# Patient Record
Sex: Female | Born: 1995
Health system: Southern US, Community
[De-identification: ages and names within clinical notes are randomized; demographics above are authoritative.]

## PROBLEM LIST (undated history)

## (undated) DIAGNOSIS — Z973 Presence of spectacles and contact lenses: Secondary | ICD-10-CM

## (undated) DIAGNOSIS — R112 Nausea with vomiting, unspecified: Secondary | ICD-10-CM

## (undated) DIAGNOSIS — N809 Endometriosis, unspecified: Secondary | ICD-10-CM

## (undated) DIAGNOSIS — K219 Gastro-esophageal reflux disease without esophagitis: Secondary | ICD-10-CM

## (undated) DIAGNOSIS — F909 Attention-deficit hyperactivity disorder, unspecified type: Secondary | ICD-10-CM

## (undated) DIAGNOSIS — K589 Irritable bowel syndrome without diarrhea: Secondary | ICD-10-CM

## (undated) DIAGNOSIS — G43909 Migraine, unspecified, not intractable, without status migrainosus: Secondary | ICD-10-CM

## (undated) DIAGNOSIS — F419 Anxiety disorder, unspecified: Secondary | ICD-10-CM

## (undated) DIAGNOSIS — G8929 Other chronic pain: Secondary | ICD-10-CM

## (undated) DIAGNOSIS — T7840XA Allergy, unspecified, initial encounter: Secondary | ICD-10-CM

## (undated) DIAGNOSIS — F32A Depression, unspecified: Secondary | ICD-10-CM

## (undated) DIAGNOSIS — Z8742 Personal history of other diseases of the female genital tract: Secondary | ICD-10-CM

## (undated) DIAGNOSIS — Z9889 Other specified postprocedural states: Secondary | ICD-10-CM

## (undated) DIAGNOSIS — Z975 Presence of (intrauterine) contraceptive device: Secondary | ICD-10-CM

## (undated) DIAGNOSIS — F3112 Bipolar disorder, current episode manic without psychotic features, moderate: Secondary | ICD-10-CM

## (undated) HISTORY — DX: Attention-deficit hyperactivity disorder, unspecified type: F90.9

## (undated) HISTORY — DX: Irritable bowel syndrome, unspecified: K58.9

## (undated) HISTORY — PX: TONSILLECTOMY: SUR1361

## (undated) HISTORY — DX: Bipolar disorder, current episode manic without psychotic features, moderate: F31.12

## (undated) HISTORY — DX: Depression, unspecified: F32.A

## (undated) HISTORY — DX: Endometriosis, unspecified: N80.9

## (undated) HISTORY — DX: Allergy, unspecified, initial encounter: T78.40XA

## (undated) HISTORY — DX: Anxiety disorder, unspecified: F41.9

---
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
## 2017-05-19 DIAGNOSIS — Z9189 Other specified personal risk factors, not elsewhere classified: Secondary | ICD-10-CM

## 2017-05-19 HISTORY — DX: Other specified personal risk factors, not elsewhere classified: Z91.89

## 2018-02-06 DIAGNOSIS — S46912A Strain of unspecified muscle, fascia and tendon at shoulder and upper arm level, left arm, initial encounter: Secondary | ICD-10-CM | POA: Diagnosis not present

## 2019-05-23 DIAGNOSIS — M778 Other enthesopathies, not elsewhere classified: Secondary | ICD-10-CM | POA: Diagnosis not present

## 2019-06-01 DIAGNOSIS — F321 Major depressive disorder, single episode, moderate: Secondary | ICD-10-CM | POA: Diagnosis not present

## 2019-06-01 DIAGNOSIS — R630 Anorexia: Secondary | ICD-10-CM | POA: Diagnosis not present

## 2019-06-01 DIAGNOSIS — R112 Nausea with vomiting, unspecified: Secondary | ICD-10-CM | POA: Diagnosis not present

## 2019-06-01 DIAGNOSIS — M779 Enthesopathy, unspecified: Secondary | ICD-10-CM | POA: Diagnosis not present

## 2019-06-07 DIAGNOSIS — M79631 Pain in right forearm: Secondary | ICD-10-CM | POA: Diagnosis not present

## 2019-06-07 DIAGNOSIS — M7701 Medial epicondylitis, right elbow: Secondary | ICD-10-CM | POA: Diagnosis not present

## 2019-06-14 DIAGNOSIS — M7701 Medial epicondylitis, right elbow: Secondary | ICD-10-CM | POA: Diagnosis not present

## 2019-06-14 DIAGNOSIS — M79631 Pain in right forearm: Secondary | ICD-10-CM | POA: Diagnosis not present

## 2019-06-29 DIAGNOSIS — R1084 Generalized abdominal pain: Secondary | ICD-10-CM | POA: Diagnosis not present

## 2019-06-29 DIAGNOSIS — R198 Other specified symptoms and signs involving the digestive system and abdomen: Secondary | ICD-10-CM | POA: Diagnosis not present

## 2019-06-29 DIAGNOSIS — R634 Abnormal weight loss: Secondary | ICD-10-CM | POA: Diagnosis not present

## 2019-06-29 DIAGNOSIS — R140 Abdominal distension (gaseous): Secondary | ICD-10-CM | POA: Diagnosis not present

## 2019-07-14 DIAGNOSIS — R1084 Generalized abdominal pain: Secondary | ICD-10-CM | POA: Diagnosis not present

## 2019-07-14 DIAGNOSIS — R140 Abdominal distension (gaseous): Secondary | ICD-10-CM | POA: Diagnosis not present

## 2019-07-14 DIAGNOSIS — R198 Other specified symptoms and signs involving the digestive system and abdomen: Secondary | ICD-10-CM | POA: Diagnosis not present

## 2019-08-08 DIAGNOSIS — R140 Abdominal distension (gaseous): Secondary | ICD-10-CM | POA: Diagnosis not present

## 2019-08-08 DIAGNOSIS — R1011 Right upper quadrant pain: Secondary | ICD-10-CM | POA: Diagnosis not present

## 2019-08-08 DIAGNOSIS — R198 Other specified symptoms and signs involving the digestive system and abdomen: Secondary | ICD-10-CM | POA: Diagnosis not present

## 2019-08-08 DIAGNOSIS — R110 Nausea: Secondary | ICD-10-CM | POA: Diagnosis not present

## 2019-08-09 ENCOUNTER — Other Ambulatory Visit: Payer: Self-pay | Admitting: Gastroenterology

## 2019-08-09 ENCOUNTER — Other Ambulatory Visit (HOSPITAL_COMMUNITY): Payer: Self-pay | Admitting: Gastroenterology

## 2019-08-09 DIAGNOSIS — R1011 Right upper quadrant pain: Secondary | ICD-10-CM

## 2019-08-22 ENCOUNTER — Other Ambulatory Visit: Payer: Self-pay

## 2019-08-22 ENCOUNTER — Encounter (HOSPITAL_COMMUNITY)
Admission: RE | Admit: 2019-08-22 | Discharge: 2019-08-22 | Disposition: A | Discharge status: Home / Self Care | Payer: BC Managed Care – PPO | Source: Ambulatory Visit | Attending: Gastroenterology | Admitting: Gastroenterology

## 2019-08-22 DIAGNOSIS — R1011 Right upper quadrant pain: Secondary | ICD-10-CM | POA: Insufficient documentation

## 2019-08-22 MED ORDER — TECHNETIUM TC 99M MEBROFENIN IV KIT
5.0000 | PACK | Freq: Once | INTRAVENOUS | Status: AC | PRN
  Administered 2019-08-22: 15:00:00 5 via INTRAVENOUS

## 2019-09-01 ENCOUNTER — Encounter: Payer: BC Managed Care – PPO | Admitting: Obstetrics and Gynecology

## 2019-09-08 ENCOUNTER — Ambulatory Visit: Payer: BC Managed Care – PPO | Attending: Family

## 2019-09-08 DIAGNOSIS — Z23 Encounter for immunization: Secondary | ICD-10-CM

## 2019-09-08 NOTE — Progress Notes (Signed)
   Covid-19 Vaccination Clinic  Name:  Debbie Warner    MRN: [id number] DOB: 08/17/1995  09/08/2019  [name] was observed post Covid-19 immunization for 15 minutes without incident. She was provided with Vaccine Information Sheet and instruction to access the V-Safe system.   [name] was instructed to call 911 with any severe reactions post vaccine: Marland Kitchen Difficulty breathing  . Swelling of face and throat  . A fast heartbeat  . A bad rash all over body  . Dizziness and weakness   Immunizations Administered    Name Date Dose VIS Date Route   Moderna COVID-19 Vaccine 09/08/2019  4:25 PM 0.5 mL 04/2019 Intramuscular   Manufacturer: Moderna   Lot: [id number]   NDC: 44458-483-50

## 2019-09-20 ENCOUNTER — Other Ambulatory Visit: Payer: Self-pay | Admitting: Gastroenterology

## 2019-09-20 DIAGNOSIS — R140 Abdominal distension (gaseous): Secondary | ICD-10-CM | POA: Diagnosis not present

## 2019-09-20 DIAGNOSIS — R1031 Right lower quadrant pain: Secondary | ICD-10-CM

## 2019-09-20 DIAGNOSIS — R112 Nausea with vomiting, unspecified: Secondary | ICD-10-CM | POA: Diagnosis not present

## 2019-09-20 DIAGNOSIS — R198 Other specified symptoms and signs involving the digestive system and abdomen: Secondary | ICD-10-CM | POA: Diagnosis not present

## 2019-09-30 ENCOUNTER — Ambulatory Visit
Admission: RE | Admit: 2019-09-30 | Discharge: 2019-09-30 | Disposition: A | Discharge status: Home / Self Care | Payer: BC Managed Care – PPO | Source: Ambulatory Visit | Attending: Gastroenterology | Admitting: Gastroenterology

## 2019-09-30 ENCOUNTER — Other Ambulatory Visit: Payer: Self-pay

## 2019-09-30 DIAGNOSIS — R1031 Right lower quadrant pain: Secondary | ICD-10-CM | POA: Diagnosis not present

## 2019-09-30 MED ORDER — IOPAMIDOL (ISOVUE-300) INJECTION 61%
100.0000 mL | Freq: Once | INTRAVENOUS | Status: AC | PRN
  Administered 2019-09-30: 100 mL via INTRAVENOUS

## 2019-10-04 ENCOUNTER — Ambulatory Visit: Payer: BC Managed Care – PPO | Attending: Family

## 2019-10-04 DIAGNOSIS — Z23 Encounter for immunization: Secondary | ICD-10-CM

## 2019-10-04 NOTE — Progress Notes (Signed)
   Covid-19 Vaccination Clinic  Name:  Debbie Warner    MRN: [id number] DOB: 10/05/1995  10/04/2019  [name] was observed post Covid-19 immunization for 15 minutes without incident. She was provided with Vaccine Information Sheet and instruction to access the V-Safe system.   [name] was instructed to call 911 with any severe reactions post vaccine: Marland Kitchen Difficulty breathing  . Swelling of face and throat  . A fast heartbeat  . A bad rash all over body  . Dizziness and weakness   Immunizations Administered    Name Date Dose VIS Date Route   Moderna COVID-19 Vaccine 10/04/2019  2:16 PM 0.5 mL 04/2019 Intramuscular   Manufacturer: Moderna   Lot: [id number]   NDC: 52591-028-90

## 2019-10-31 ENCOUNTER — Other Ambulatory Visit (HOSPITAL_COMMUNITY)
Admission: RE | Admit: 2019-10-31 | Discharge: 2019-10-31 | Disposition: A | Discharge status: Home / Self Care | Payer: BC Managed Care – PPO | Source: Ambulatory Visit | Attending: Obstetrics and Gynecology | Admitting: Obstetrics and Gynecology

## 2019-10-31 ENCOUNTER — Ambulatory Visit (INDEPENDENT_AMBULATORY_CARE_PROVIDER_SITE_OTHER): Payer: BC Managed Care – PPO | Admitting: Obstetrics and Gynecology

## 2019-10-31 ENCOUNTER — Other Ambulatory Visit: Payer: Self-pay

## 2019-10-31 ENCOUNTER — Encounter: Payer: Self-pay | Admitting: Obstetrics and Gynecology

## 2019-10-31 VITALS — BP 100/64 | HR 88 | Temp 97.2°F | Resp 18 | Ht 66.5 in | Wt 147.0 lb

## 2019-10-31 DIAGNOSIS — Z113 Encounter for screening for infections with a predominantly sexual mode of transmission: Secondary | ICD-10-CM | POA: Insufficient documentation

## 2019-10-31 DIAGNOSIS — N760 Acute vaginitis: Secondary | ICD-10-CM

## 2019-10-31 DIAGNOSIS — Z01419 Encounter for gynecological examination (general) (routine) without abnormal findings: Secondary | ICD-10-CM | POA: Diagnosis not present

## 2019-10-31 DIAGNOSIS — G8929 Other chronic pain: Secondary | ICD-10-CM

## 2019-10-31 DIAGNOSIS — R1031 Right lower quadrant pain: Secondary | ICD-10-CM | POA: Diagnosis not present

## 2019-10-31 DIAGNOSIS — R87612 Low grade squamous intraepithelial lesion on cytologic smear of cervix (LGSIL): Secondary | ICD-10-CM | POA: Diagnosis not present

## 2019-10-31 NOTE — Progress Notes (Signed)
GYNECOLOGY OFFICE VISIT  24 y.o. G0P0000 Unknown Caucasian female here for annual exam.    Patient complaining of vaginal discharge.  Denies odor.   Patient also had recent CT ABD/Pelvis 10/01/19. This was ordered by her GI due to "stomach issues". CT showed multiple cysts of the ovaries. Normal appendix.  See Epic.  RLQ pain since 2015. Nausea for years.  Uses Zofran as needed. Dr. Guillermina [place] at [facility].  She had a normal HIDA scan.   Had laparoscopy in 2016 and told she had endometriosis.  She had scar tissue noted at the time.  She was offered Depo Lupron but she declined.  She did get resolution of her pain for some years.  Birth control pills made her suicidal in the past.  Used Nexplanon also.   She currently has right lower quadrant pain and cannot lie on her back.  She feels some need to have a BM and doing this relieves her pain again.  No blood in her stool.  Patient is interested in doing another laparoscopy.  She notices increased facial hair and thigh hair.  Completed her Covid vaccine.   PCP:   None  No LMP recorded. (Menstrual status: IUD).           Sexually active: No.  The current method of family planning is IUD--Mirena 05/2017.    Exercising: No.  The patient does not participate in regular exercise at present. Smoker:  Yes, smokes Marijuana for nausea  Health Maintenance: Pap: 11/2018 abnormal per patient History of abnormal Pap:  Yes, Hx of abnormal pap 11/2018 and had colposcopy but no treatment--in [place] MMG:  n/a Colonoscopy:  n/a BMD:   n/a  Result  n/a TDaP:  Up to date Gardasil:   yes HIV:no Hep C:no Screening Labs:  ---   reports that she has never smoked. She has never used smokeless tobacco. She reports previous alcohol use. She reports current drug use. Frequency: 1.00 time per week. Drug: Marijuana.  Past Medical History:  Diagnosis Date  . Abnormal Pap smear of cervix    Hx of colposcopy  . ADHD   . Anxiety   . Depression   .  Dysmenorrhea   . Endometriosis   . History of suicidal tendencies 2019  . Hives    Hx of "anxiety Hives"--takes hydroxyzine  . IBS (irritable bowel syndrome)     Past Surgical History:  Procedure Laterality Date  . fractured arm Right 2008  . PELVIC LAPAROSCOPY  2016   dx'd with endometriosis per patient--[place]  . TONSILLECTOMY      Current Outpatient Medications  Medication Sig Dispense Refill  . esomeprazole (NEXIUM) 20 MG packet Take 20 mg by mouth daily before breakfast.    . hydrOXYzine (ATARAX/VISTARIL) 25 MG tablet Take 25 mg by mouth as needed.    . ondansetron (ZOFRAN) 4 MG tablet Take 4 mg by mouth at bedtime.     No current facility-administered medications for this visit.    Family History  Problem Relation Age of Onset  . Diabetes Mother   . Cancer Maternal Grandmother 75       Dec from colon cancer  . Diabetes Maternal Grandfather   . Stroke Maternal Grandfather   . Stroke Maternal Aunt     Review of Systems  All other systems reviewed and are negative.   Exam:   BP 100/64   Pulse 88   Temp (!) 97.2 F (36.2 C) (Temporal)   Resp 18  Ht 5' 6.5" (1.689 m)   Wt 147 lb (66.7 kg)   BMI 23.37 kg/m     General appearance: alert, cooperative and appears stated age Head: normocephalic, without obvious abnormality, atraumatic Neck: no adenopathy, supple, symmetrical, trachea midline and thyroid normal to inspection and palpation Lungs: clear to auscultation bilaterally Breasts: normal appearance, no masses or tenderness, No nipple retraction or dimpling, No nipple discharge or bleeding, No axillary adenopathy Heart: regular rate and rhythm Abdomen: soft, non-tender; no masses, no organomegaly Extremities: extremities normal, atraumatic, no cyanosis or edema Skin: skin color, texture, turgor normal. No rashes or lesions Lymph nodes: cervical, supraclavicular, and axillary nodes normal. Neurologic: grossly normal  Pelvic: External genitalia:  no  lesions              No abnormal inguinal nodes palpated.              Urethra:  normal appearing urethra with no masses, tenderness or lesions              Bartholins and Skenes: normal                 Vagina: normal appearing vagina with normal color and discharge, no lesions              Cervix: no lesions.  IUD strings noted.               Pap taken: Yes.   Bimanual Exam:  Uterus:  normal size, contour, position, consistency, mobility, non-tender              Adnexa: no mass, fullness, tenderness              Rectal exam:  No masses.  Normal sphincter tone.   Chaperone was present for exam.  Assessment:   Well woman visit with normal exam. Hx abnormal pap.  Hx laparoscopy for endometriosis.  RLQ pain.  STD screening. Vaginitis.  Mirena IUD.  Hx suicidal ideation.   Plan: Mammogram screening discussed. Self breast awareness reviewed. Pap and HR HPV as above. Guidelines for Calcium, Vitamin D, regular exercise program including cardiovascular and weight bearing exercise. STD screening.  Routine labs. Return for pelvic US.  Will get a copy of her abnormal pap and colposcopy and prior laparoscopy surgery. Follow up annually and prn.   After visit summary provided.

## 2019-10-31 NOTE — Patient Instructions (Signed)

## 2019-11-01 ENCOUNTER — Telehealth: Payer: Self-pay | Admitting: Obstetrics and Gynecology

## 2019-11-01 LAB — COMPREHENSIVE METABOLIC PANEL
ALT: 4 IU/L (ref 0–32)
AST: 9 IU/L (ref 0–40)
Albumin/Globulin Ratio: 2.1 (ref 1.2–2.2)
Albumin: 4.6 g/dL (ref 3.9–5.0)
Alkaline Phosphatase: 73 IU/L (ref 48–121)
BUN/Creatinine Ratio: 8 — ABNORMAL LOW (ref 9–23)
BUN: 6 mg/dL (ref 6–20)
Bilirubin Total: 0.5 mg/dL (ref 0.0–1.2)
CO2: 22 mmol/L (ref 20–29)
Calcium: 9.8 mg/dL (ref 8.7–10.2)
Chloride: 106 mmol/L (ref 96–106)
Creatinine, Ser: 0.8 mg/dL (ref 0.57–1.00)
GFR calc Af Amer: 120 mL/min/{1.73_m2} (ref >59)
GFR calc non Af Amer: 104 mL/min/{1.73_m2} (ref >59)
Globulin, Total: 2.2 g/dL (ref 1.5–4.5)
Glucose: 86 mg/dL (ref 65–99)
Potassium: 4.1 mmol/L (ref 3.5–5.2)
Sodium: 141 mmol/L (ref 134–144)
Total Protein: 6.8 g/dL (ref 6.0–8.5)

## 2019-11-01 LAB — HEP, RPR, HIV PANEL
HIV Screen 4th Generation wRfx: NONREACTIVE
Hepatitis B Surface Ag: NEGATIVE
RPR Ser Ql: NONREACTIVE

## 2019-11-01 LAB — CYTOLOGY - PAP
Comment: NEGATIVE
High risk HPV: POSITIVE — AB

## 2019-11-01 LAB — LIPID PANEL
Chol/HDL Ratio: 3.5 ratio (ref 0.0–4.4)
Cholesterol, Total: 142 mg/dL (ref 100–199)
HDL: 41 mg/dL (ref >39)
LDL Chol Calc (NIH): 83 mg/dL (ref 0–99)
Triglycerides: 98 mg/dL (ref 0–149)
VLDL Cholesterol Cal: 18 mg/dL (ref 5–40)

## 2019-11-01 LAB — CBC
Hematocrit: 42.2 % (ref 34.0–46.6)
Hemoglobin: 13.9 g/dL (ref 11.1–15.9)
MCH: 30.2 pg (ref 26.6–33.0)
MCHC: 32.9 g/dL (ref 31.5–35.7)
MCV: 92 fL (ref 79–97)
Platelets: 397 10*3/uL (ref 150–450)
RBC: 4.61 x10E6/uL (ref 3.77–5.28)
RDW: 13.3 % (ref 11.7–15.4)
WBC: 10.2 10*3/uL (ref 3.4–10.8)

## 2019-11-01 LAB — HEPATITIS C ANTIBODY: Hep C Virus Ab: <0.1 s/co ratio (ref 0.0–0.9)

## 2019-11-01 NOTE — Telephone Encounter (Signed)
Call to patient. Unable to leave voicemail requesting a return call to Hayley to review benefits and schedule recommended Pelvic ultrasound with Brook A. Silva, MD, FACOG °

## 2019-11-02 ENCOUNTER — Telehealth: Payer: Self-pay

## 2019-11-02 LAB — CERVICOVAGINAL ANCILLARY ONLY
Bacterial Vaginitis (gardnerella): NEGATIVE
Candida Glabrata: NEGATIVE
Candida Vaginitis: NEGATIVE
Chlamydia: NEGATIVE
Comment: NEGATIVE
Comment: NEGATIVE
Comment: NEGATIVE
Comment: NEGATIVE
Comment: NEGATIVE
Comment: NORMAL
Neisseria Gonorrhea: NEGATIVE
Trichomonas: NEGATIVE

## 2019-11-02 NOTE — Telephone Encounter (Signed)
Spoke with pt. Pt given results and recommendations per Dr Edward Jolly. Pt agreeable and verbalized understanding.  Pt tearful with the results. Pt wants to do colpo as soon as possible. Pt advised no appts available at this moment. Advised will return call with appt for colpo. Pt agreeable.

## 2019-11-02 NOTE — Telephone Encounter (Signed)
Patient is returning call to Stephanie.

## 2019-11-02 NOTE — Telephone Encounter (Signed)
-----   Message from Patton Salles, MD sent at 11/02/2019 12:46 PM EDT ----- Please contact patient with results of testing.  Her pap showed LGSIL and possible HGSIL, and she tested positive for HR HPV.  She needs colposcopy.  Please precert and schedule with me.   Her testing for infectious disease is negative for HIV, syphilis, hepatitis B and C, trichomonas, gonorrhea, and chlamydia.   Vaginitis testing is negative for yeast and bacterial vaginosis.   Cholesterol, blood counts, and blood chemistries looked good.

## 2019-11-02 NOTE — Telephone Encounter (Signed)
Reviewed with Dr Edward Jolly. Pt to have colpo on 11/08/19 at 945 am.   Call placed back to pt. Pt given update on appt for colpo. Pt states will be out of town from 6/22-6/27. Pt scheduled for colpo on 7/1 at 10 am. Advised will review appt with Dr Edward Jolly and return call to pt with any further recommendations or advice. Pt agreeable. Pt verbalized understanding of date and time of appt for colpo. Pt advised to take Motrin 800 mg with food and water one hour before procedure. Pt agreeable.   Routing to Dr. Edward Jolly for review.  Encounter closed.

## 2019-11-03 NOTE — Telephone Encounter (Signed)
Spoke with patient regarding benefits for recommended ultrasound. Patient is aware that ultrasound is transvaginal. Patient acknowledges understanding of information presented. Patient is aware of cancellation policy. Patient scheduled appointment for 11/24/2019 at 1100AM with Brook A. Edward Jolly, MD, [facility]. Encounter closed.

## 2019-11-03 NOTE — Telephone Encounter (Signed)
Patient is calling to check status of making [place] appointment.

## 2019-11-09 ENCOUNTER — Telehealth: Payer: Self-pay | Admitting: Obstetrics and Gynecology

## 2019-11-09 ENCOUNTER — Other Ambulatory Visit: Payer: Self-pay | Admitting: *Deleted

## 2019-11-09 DIAGNOSIS — R87612 Low grade squamous intraepithelial lesion on cytologic smear of cervix (LGSIL): Secondary | ICD-10-CM

## 2019-11-09 DIAGNOSIS — R87810 Cervical high risk human papillomavirus (HPV) DNA test positive: Secondary | ICD-10-CM

## 2019-11-09 NOTE — Telephone Encounter (Signed)
Call placed to convey benefits. Spoke with the patient and conveyed the benefits. Patient understands/agreeable with the benefits. Patient is aware of the cancellation policy. Appointment scheduled 11/17/19.

## 2019-11-14 ENCOUNTER — Telehealth: Payer: Self-pay

## 2019-11-14 NOTE — Telephone Encounter (Signed)
Patient called to cancel Colpo due to being sick. Patient would like to reschedule as soon as possible.

## 2019-11-14 NOTE — Telephone Encounter (Signed)
Spoke with pt. Pt states having cold/congestion symptoms while being out of town visiting family. Pt states taking allergy medication and is better. Thinks it was from environment she was in with pastures and corn fields.   Pt cancelled colpo for 7/1. Pt rescheduled colpo for 12/01/19 at 1130 am with Dr Edward Jolly. Pt advised to keep PUS on 11/24/19 and to call in 48 advance if still not feeling better to reschedule. Pt verbalized understanding of both appt. Cancellation policy reviewed, pt agreeable.   Routing to Dr Edward Jolly for review.  Encounter closed.

## 2019-11-17 ENCOUNTER — Ambulatory Visit: Payer: Self-pay | Admitting: Obstetrics and Gynecology

## 2019-11-24 ENCOUNTER — Ambulatory Visit (INDEPENDENT_AMBULATORY_CARE_PROVIDER_SITE_OTHER): Payer: BC Managed Care – PPO | Admitting: Obstetrics and Gynecology

## 2019-11-24 ENCOUNTER — Ambulatory Visit (INDEPENDENT_AMBULATORY_CARE_PROVIDER_SITE_OTHER): Payer: BC Managed Care – PPO

## 2019-11-24 ENCOUNTER — Other Ambulatory Visit: Payer: Self-pay

## 2019-11-24 ENCOUNTER — Encounter: Payer: Self-pay | Admitting: Obstetrics and Gynecology

## 2019-11-24 VITALS — BP 100/70 | HR 88 | Ht 66.5 in | Wt 147.0 lb

## 2019-11-24 DIAGNOSIS — R1031 Right lower quadrant pain: Secondary | ICD-10-CM

## 2019-11-24 DIAGNOSIS — R87810 Cervical high risk human papillomavirus (HPV) DNA test positive: Secondary | ICD-10-CM | POA: Diagnosis not present

## 2019-11-24 DIAGNOSIS — G8929 Other chronic pain: Secondary | ICD-10-CM

## 2019-11-24 DIAGNOSIS — R87612 Low grade squamous intraepithelial lesion on cytologic smear of cervix (LGSIL): Secondary | ICD-10-CM | POA: Diagnosis not present

## 2019-11-24 DIAGNOSIS — R102 Pelvic and perineal pain: Secondary | ICD-10-CM | POA: Diagnosis not present

## 2019-11-24 DIAGNOSIS — R198 Other specified symptoms and signs involving the digestive system and abdomen: Secondary | ICD-10-CM

## 2019-11-24 MED ORDER — ALPRAZOLAM 0.5 MG PO TABS: ORAL_TABLET | ORAL | 0 refills | Status: DC

## 2019-11-24 NOTE — Progress Notes (Signed)
GYNECOLOGY  VISIT   HPI: 24 y.o.   Unknown  Caucasian  female   G0P0000 with No LMP recorded. (Menstrual status: IUD).   here for pelvic ultrasound.    Chronic RLQ pain.  Op report received from her laparoscopy indicating she had lysis of adhesions.  Having a BM relieves her pain.  Sees Dr. Guillermina City for her GI care.    She has not discussed this pain with him.  She had a Hida scan and a CT scan.  She did not have a colonoscopy.   Has Mirena IUD.  Hx suicidal ideation with OCPs.  Hx laparoscopy with lysis of adhesions in 2016.  Feels like she has a cut on the inside of the vaginal.  She had a painful orgasm recently with masturbation and use of a vaginal toy.  She was very anxious during the colposcopy.  She has taken valium prior to dental work.   GYNECOLOGIC HISTORY: No LMP recorded. (Menstrual status: IUD). Contraception:  Mirena IUD 05/2017 Menopausal hormone therapy:  n/a Last mammogram:  n/a Last pap smear: 10-31-19 LGSIL:Pos HR HPV, 11/2018 abnormal per pt        OB History    Gravida  0   Para  0   Term  0   Preterm  0   AB  0   Living  0     SAB  0   TAB  0   Ectopic  0   Multiple  0   Live Births  0              There are no problems to display for this patient.   Past Medical History:  Diagnosis Date  . Abnormal Pap smear of cervix    Hx of colposcopy  . ADHD   . Anxiety   . Depression   . Dysmenorrhea   . Endometriosis   . History of suicidal tendencies 2019  . Hives    Hx of "anxiety Hives"--takes hydroxyzine  . IBS (irritable bowel syndrome)     Past Surgical History:  Procedure Laterality Date  . fractured arm Right 2008  . PELVIC LAPAROSCOPY  2016   dx'd with endometriosis per patient--[place], [place].  Op report indicates lysis of adhesions.   . TONSILLECTOMY      Current Outpatient Medications  Medication Sig Dispense Refill  . dicyclomine (BENTYL) 10 MG capsule Take 10 mg by mouth 4 (four) times daily -  before meals  and at bedtime.    Marland Kitchen esomeprazole (NEXIUM) 20 MG packet Take 20 mg by mouth daily before breakfast.    . hydrOXYzine (ATARAX/VISTARIL) 25 MG tablet Take 25 mg by mouth as needed.    . ondansetron (ZOFRAN) 4 MG tablet Take 4 mg by mouth at bedtime.    . ALPRAZolam (XANAX) 0.5 MG tablet Take one tablet (0.5 mg) one hour prior to procedure. 2 tablet 0   No current facility-administered medications for this visit.     ALLERGIES: Hydrocodone, Macrobid [nitrofurantoin], and Sulfa antibiotics  Family History  Problem Relation Age of Onset  . Diabetes Mother   . Cancer Maternal Grandmother 75       Dec from colon cancer  . Diabetes Maternal Grandfather   . Stroke Maternal Grandfather   . Stroke Maternal Aunt     Social History   Socioeconomic History  . Marital status: Unknown    Spouse name: Not on file  . Number of children: Not on file  . Years of  education: Not on file  . Highest education level: Not on file  Occupational History  . Not on file  Tobacco Use  . Smoking status: Never Smoker  . Smokeless tobacco: Never Used  Vaping Use  . Vaping Use: Never used  Substance and Sexual Activity  . Alcohol use: Not Currently  . Drug use: Yes    Frequency: 1.0 times per week    Types: Marijuana    Comment: uses for nausea  . Sexual activity: Not Currently    Birth control/protection: I.U.D.    Comment: Mirena IUD 05/2017  Other Topics Concern  . Not on file  Social History Narrative  . Not on file   Social Determinants of Health   Financial Resource Strain:   . Difficulty of Paying Living Expenses:   Food Insecurity:   . Worried About Programme researcher, broadcasting/film/video in the Last Year:   . Barista in the Last Year:   Transportation Needs:   . Freight forwarder (Medical):   Marland Kitchen Lack of Transportation (Non-Medical):   Physical Activity:   . Days of Exercise per Week:   . Minutes of Exercise per Session:   Stress:   . Feeling of Stress :   Social Connections:   .  Frequency of Communication with Friends and Family:   . Frequency of Social Gatherings with Friends and Family:   . Attends Religious Services:   . Active Member of Clubs or Organizations:   . Attends Banker Meetings:   Marland Kitchen Marital Status:   Intimate Partner Violence:   . Fear of Current or Ex-Partner:   . Emotionally Abused:   Marland Kitchen Physically Abused:   . Sexually Abused:     Review of Systems  All other systems reviewed and are negative.   PHYSICAL EXAMINATION:    BP 100/70   Pulse 88   Ht 5' 6.5" (1.689 m)   Wt 147 lb (66.7 kg)   BMI 23.37 kg/m     General appearance: alert, cooperative and appears stated age   Pelvic: External genitalia:  no lesions              Urethra:  normal appearing urethra with no masses, tenderness or lesions              Bartholins and Skenes: normal                 Vagina: normal appearing vagina with normal color and clumpy white discharge, no lesions              Cervix: no lesions.  IUD strings noted.                Bimanual Exam:  Uterus:  normal size, contour, position, consistency, mobility, non-tender              Adnexa: no mass, fullness, tenderness           Chaperone was present for exam.  Pelvic US Uterus no masses.  EMS 2.31 mm. IUD in endometrial canal.  Ovaries normal.  No free fluid.  ASSESSMENT  Pain with BMs.  Chronic RLQ pain.  Status post laparoscopy with lysis of adhesions on final op report.  Mirena IUD.  Recent abnormal pap - LGSIL and possible HGSIL, positive HR HPV. Vaginal pain.   PLAN  Pelvic US images and reports reviewed. We discussed her pelvic pain and possible etiologies - fibrosis, adhesions, colon source.  We discussed laparoscopy for further  evaluation, but I would recommend seeing GI first.  She will return back to her GI.  She may need a colonoscopy.  Abnormal pap and HPV discussed.  Return for colposcopy.  Affirm testing done.  An After Visit Summary was printed and given to  the patient.  ___45___ minute consultation.

## 2019-11-25 LAB — VAGINITIS/VAGINOSIS, DNA PROBE
Candida Species: NEGATIVE
Gardnerella vaginalis: NEGATIVE
Trichomonas vaginosis: NEGATIVE

## 2019-11-27 ENCOUNTER — Encounter: Payer: Self-pay | Admitting: Obstetrics and Gynecology

## 2019-11-29 NOTE — Progress Notes (Signed)
  Subjective:     Patient ID: Debbie Warner, female   DOB: 27-Feb-1996, 24 y.o.   MRN: [id number]  HPI  Patient here today for colposcopy with pap from  10-31-19 revealing LGSIL:Pos HR HPV. Possible HGSIL on pap.  Patient states she had an abnormal pap 11/2018.  Anxiety with procedures. Took Xanax about 11:00 am.  Mother is her to drive her home.  She tested negative for yeast/BV/trichomonas.  She wants blood work for fatigue.  Not sleeping well.  Has anxiety.  [facility]  Review of Systems  LMP: IUD Contraception: Mirena IUD 05/2017     Objective:   Physical Exam Genitourinary:       Colposcopy.  Consent for procedure.  3% acetic acid used.  White light and green light filter used.  Colposcopy satisfactory.  IUD strings noted.  Acetowhite change with mosaics along the superior portion of the cervix from 9:00 to 3:00.  ECC, biopsy at 9:00 and 1:00 - all sent to pathology separately.  No vaginal lesions.  Monsel's placed.  No complications.  Minimal EBL.     Assessment:     Pap LGSIL, possible HGSIL.  Positive HR HPV.  Mirena IUD. Fatigue.    Plan:     FU biopsies.  Post colposcopy precautions given.  If treatment is done, patient would prefer to do in an outpatient setting.  TSH, vit B12, and vit D.

## 2019-12-01 ENCOUNTER — Ambulatory Visit (INDEPENDENT_AMBULATORY_CARE_PROVIDER_SITE_OTHER): Payer: BC Managed Care – PPO | Admitting: Obstetrics and Gynecology

## 2019-12-01 ENCOUNTER — Other Ambulatory Visit (HOSPITAL_COMMUNITY)
Admission: RE | Admit: 2019-12-01 | Discharge: 2019-12-01 | Disposition: A | Discharge status: Home / Self Care | Payer: BC Managed Care – PPO | Source: Ambulatory Visit | Attending: Obstetrics and Gynecology | Admitting: Obstetrics and Gynecology

## 2019-12-01 ENCOUNTER — Other Ambulatory Visit: Payer: Self-pay

## 2019-12-01 ENCOUNTER — Encounter: Payer: Self-pay | Admitting: Obstetrics and Gynecology

## 2019-12-01 VITALS — BP 104/62 | HR 72 | Resp 16 | Ht 66.5 in | Wt 145.0 lb

## 2019-12-01 DIAGNOSIS — R7989 Other specified abnormal findings of blood chemistry: Secondary | ICD-10-CM

## 2019-12-01 DIAGNOSIS — R87612 Low grade squamous intraepithelial lesion on cytologic smear of cervix (LGSIL): Secondary | ICD-10-CM

## 2019-12-01 DIAGNOSIS — R5383 Other fatigue: Secondary | ICD-10-CM

## 2019-12-01 DIAGNOSIS — R978 Other abnormal tumor markers: Secondary | ICD-10-CM | POA: Diagnosis not present

## 2019-12-01 DIAGNOSIS — R87810 Cervical high risk human papillomavirus (HPV) DNA test positive: Secondary | ICD-10-CM | POA: Diagnosis not present

## 2019-12-01 DIAGNOSIS — N870 Mild cervical dysplasia: Secondary | ICD-10-CM | POA: Diagnosis not present

## 2019-12-01 DIAGNOSIS — N871 Moderate cervical dysplasia: Secondary | ICD-10-CM | POA: Diagnosis not present

## 2019-12-01 DIAGNOSIS — N72 Inflammatory disease of cervix uteri: Secondary | ICD-10-CM | POA: Diagnosis not present

## 2019-12-02 LAB — VITAMIN D 25 HYDROXY (VIT D DEFICIENCY, FRACTURES): Vit D, 25-Hydroxy: 18.8 ng/mL — ABNORMAL LOW (ref 30.0–100.0)

## 2019-12-02 LAB — TSH: TSH: 0.878 u[IU]/mL (ref 0.450–4.500)

## 2019-12-02 LAB — VITAMIN B12: Vitamin B-12: 514 pg/mL (ref 232–1245)

## 2019-12-05 ENCOUNTER — Other Ambulatory Visit: Payer: Self-pay | Admitting: *Deleted

## 2019-12-05 MED ORDER — VITAMIN D (ERGOCALCIFEROL) 1.25 MG (50000 UNIT) PO CAPS: 50000.0000 [IU] | ORAL_CAPSULE | ORAL | 0 refills | Status: DC

## 2019-12-06 LAB — SURGICAL PATHOLOGY

## 2019-12-08 ENCOUNTER — Encounter: Payer: Self-pay | Admitting: Obstetrics and Gynecology

## 2019-12-08 ENCOUNTER — Telehealth: Payer: Self-pay | Admitting: Obstetrics and Gynecology

## 2019-12-08 NOTE — Telephone Encounter (Signed)
To Hayley Carder for precert. 

## 2019-12-08 NOTE — Telephone Encounter (Signed)
Routing to Dr. Edward Jolly to review 12/01/19 colpo results.

## 2019-12-08 NOTE — Telephone Encounter (Signed)
Patient called for recent colposcopy results.

## 2019-12-08 NOTE — Telephone Encounter (Signed)
Leda Min, RN  12/08/2019 1:30 PM EDT Back to Top    Spoke with patient, advised as seen below per Dr. Edward Jolly.  Patient request to proceed with scheduling LEEP.  Advised patient Yvonna Alanis, RN will be in contact to review scheduling, will also schedule pre-op OV with Dr. Edward Jolly. Patient verbalizes understanding and is agreeable.   See open telephone encounter dated 12/08/19.     Routing to [employer], RN  Cc: [employer]

## 2019-12-08 NOTE — Telephone Encounter (Signed)
Please see result note with plan.   She has HGSIL with endocervical glandular extension, and I am recommending a LEEP at [facility].

## 2019-12-08 NOTE — Telephone Encounter (Signed)
-----   Message from Patton Salles, MD sent at 12/08/2019  1:03 PM EDT ----- Please contact patient with results of her colposcopy.  See also phone noted.  Her colposcopy shows HGSIL of the cervix.  These are abnormal cells and some extend into of the deeper layers of her cervix.  She does not have cancer.   I recommend she have a LEEP procedure with me at [facility].  This will remove the abnormal cells. She has difficulty with medical procedures and has already requested an outpatient surgical setting if treatment were to be needed.   I do recommend a preop scheduled with me.

## 2019-12-09 NOTE — Telephone Encounter (Signed)
Call to patient. Unable to leave voicemail requesting a return call to [facility] to review benefits for recommended surgery with Brook A. Silva, MD, FACOG. 

## 2019-12-12 NOTE — Telephone Encounter (Signed)
Call to patient. Unable to leave voicemail requesting a return call to [facility] to review benefits for recommended surgery with Brook A. Edward Jolly, MD, [facility].

## 2019-12-13 NOTE — Telephone Encounter (Signed)
Spoke with patient regarding surgery benefits. Patient acknowledges understanding of information presented. Patient is aware that benefits presented are for professional benefits only. Patient is aware that once surgery is scheduled, the hospital will call with separate benefits. Patient is aware of surgery cancellation policy.  Patient is requesting surgery date of 01/17/2020.

## 2019-12-13 NOTE — Telephone Encounter (Signed)
Spoke with patient. LEEP with ECC scheduled for 12/27/2019 at 0730 at [facility]. Pre op scheduled for 12/14/2019 at 4 pm with Dr.Silva. COVID test scheduled for 12/23/2019 at 3:15 pm at [facility] location. Patient is aware of the need to quarantine after test until surgery. 4 week post op scheduled for 01/24/2020 at 4:30 pm with Dr.Silva. Surgery instructions reviewed and to be given to patient at her appointment on 12/14/2019.  Routing to provider and will close encounter.

## 2019-12-14 ENCOUNTER — Encounter: Payer: Self-pay | Admitting: Obstetrics and Gynecology

## 2019-12-14 ENCOUNTER — Ambulatory Visit (INDEPENDENT_AMBULATORY_CARE_PROVIDER_SITE_OTHER): Payer: BC Managed Care – PPO | Admitting: Obstetrics and Gynecology

## 2019-12-14 ENCOUNTER — Other Ambulatory Visit: Payer: Self-pay

## 2019-12-14 VITALS — BP 100/60 | HR 84 | Ht 66.5 in | Wt 148.0 lb

## 2019-12-14 DIAGNOSIS — R87613 High grade squamous intraepithelial lesion on cytologic smear of cervix (HGSIL): Secondary | ICD-10-CM | POA: Diagnosis not present

## 2019-12-14 DIAGNOSIS — R102 Pelvic and perineal pain: Secondary | ICD-10-CM

## 2019-12-14 DIAGNOSIS — R198 Other specified symptoms and signs involving the digestive system and abdomen: Secondary | ICD-10-CM

## 2019-12-14 DIAGNOSIS — R110 Nausea: Secondary | ICD-10-CM | POA: Diagnosis not present

## 2019-12-14 NOTE — Patient Instructions (Signed)
Loop Electrosurgical Excision Procedure Loop electrosurgical excision procedure (LEEP) is the cutting and removal (excision) of tissue from the cervix. The cervix is the bottom part of the uterus that opens into the vagina. The tissue that is removed from the cervix is examined to see if there are precancerous cells or cancer cells present. LEEP may be done when:  You have abnormal bleeding from your cervix.  You have an abnormal Pap test result.  Your health care provider finds an abnormality on your cervix during a pelvic exam. LEEP typically only takes a few minutes and is often done in the health care provider's office. The procedure is safe for women who are trying to get pregnant. However, the procedure is usually not done during a menstrual period or during pregnancy. Tell a health care provider about:  Any allergies you have.  All medicines you are taking, including vitamins, herbs, eye drops, creams, and over-the-counter medicines.  Any blood disorders you have.  Any medical conditions you have, including current or past vaginal infections such as herpes or sexually-transmitted infections (STIs).  Whether you are pregnant or may be pregnant.  Whether or not you are having vaginal bleeding on the day of the procedure. What are the risks? Generally, this is a safe procedure. However, problems may occur, including:  Infection.  Bleeding.  Allergic reactions to medicines.  Changes or scarring in the cervix.  Increased risk of early (preterm) labor in future pregnancies. What happens before the procedure?  Ask your health care provider about: ? Changing or stopping your regular medicines. This is especially important if you are taking diabetes medicines or blood thinners. ? Taking medicines such as aspirin and ibuprofen. These medicines can thin your blood. Do not take these medicines unless your health care provider tells you to take them. ? Taking over-the-counter  medicines, vitamins, herbs, and supplements.  Your health care provider may recommend that you take pain medicine before the procedure.  Ask your health care provider if you should plan to have someone take you home after the procedure. What happens during the procedure?   An instrument called a speculum will be placed in your vagina. This will allow your health care provider to see your cervix.  You will be given a medicine to numb the area (local anesthetic). The medicine will be injected into your cervix and the surrounding area.  A solution will be applied to your cervix. This solution will help the health care provider find the abnormal cells that need to be removed.  A thin wire loop will be passed through your vagina. The wire will be used to burn (cauterize) the cervical tissue with an electrical current.  You may feel faint during the procedure. Tell your health care provider right away if you feel this way.  The abnormal cervical tissue will be removed.  Any open blood vessels will be cauterized to prevent bleeding.  A paste may be applied to the cauterized area of your cervix to help prevent bleeding.  The sample of cervical tissue will be examined under a microscope. The procedure may vary among health care providers and hospitals. What can I expect after the procedure? After the procedure, it is common to have:  Mild abdominal cramps that are similar to menstrual cramps. These may last for up to 1 week.  A small amount of pink-tinged or bloody vaginal discharge, including light to moderate bleeding, for 1-2 weeks.  A dark-colored discharge coming from your vagina. This is from   the paste that was used on the cervix to prevent bleeding. It is up to you to get the results of your procedure. Ask your health care provider, or the department that is doing the procedure, when your results will be ready. Follow these instructions at home:  Take over-the-counter and  prescription medicines only as told by your health care provider.  Return to your normal activities as told by your health care provider. Ask your health care provider what activities are safe for you.  Do not put anything in your vagina for 2 weeks after the procedure or until your health care provider says that it is okay. This includes tampons, creams, and douches.  Do not have sex until your health care provider approves.  Keep all follow-up visits as told by your health care provider. This is important. Contact a health care provider if you:  Have a fever or chills.  Feel unusually weak.  Have vaginal bleeding that is heavier or longer than a normal menstrual cycle. A sign of this can be soaking a pad with blood or bleeding with clots.  Develop a bad smelling vaginal discharge.  Have severe abdominal pain or cramping. Summary  Loop electrosurgical excision procedure (LEEP) is the removal of tissue from the cervix. The removed tissue will be checked for precancerous cells or cancer cells.  LEEP typically only takes a few minutes and is often done in the health care provider's office.  Do not put anything in your vagina for 2 weeks after the procedure or until your health care provider says that it is okay. This includes tampons, creams, and douches.  Keep all follow-up visits as told by your health care provider. Ask your health care provider, or the department that is doing the procedure, when your results will be ready. This information is not intended to replace advice given to you by your health care provider. Make sure you discuss any questions you have with your health care provider. Document Revised: 05/28/2018 Document Reviewed: 05/28/2018 Elsevier Patient Education  2020 Elsevier Inc.  

## 2019-12-14 NOTE — Progress Notes (Signed)
GYNECOLOGY  VISIT   HPI: 24 y.o.   Single  Caucasian  female   G0P0000 with No LMP recorded. (Menstrual status: IUD).   here for surgical consult.  Pap showed LGSIL possible HGSIL, positive HR HPV. Colposcopy showed HGSIL, CIN II with endocervical glandular extension.  Patient has anxiety with procedures.   Smokes marijuana to relieve nausea.   She is still having lower pelvic pain, and she had a normal pelvic ultrasound.  She has pain associated with bowel movements.  She has a gastroenterologist. No coloscopy to date. She states she needs a referral to see her GI again.   Has received all gardasil injections.   GYNECOLOGIC HISTORY: No LMP recorded. (Menstrual status: IUD). Contraception:  Mirena IUD 05/2017 Menopausal hormone therapy:  n/a Last mammogram:  n/a Last pap smear: 10-31-19 LGSIL:Pos HR HPV--colpo revealed HGSIL--LEEP scheduled 12-27-19, 11/2018 abnormal per pt        OB History    Gravida  0   Para  0   Term  0   Preterm  0   AB  0   Living  0     SAB  0   TAB  0   Ectopic  0   Multiple  0   Live Births  0              There are no problems to display for this patient.   Past Medical History:  Diagnosis Date  . Abnormal Pap smear of cervix    Hx of colposcopy  . ADHD   . Anxiety   . Depression   . Dysmenorrhea   . Endometriosis   . High grade squamous intraepithelial cervical dysplasia 11/2019   Endocervical glandular extension  . History of suicidal tendencies 2019  . Hives    Hx of "anxiety Hives"--takes hydroxyzine  . IBS (irritable bowel syndrome)     Past Surgical History:  Procedure Laterality Date  . fractured arm Right 2008  . PELVIC LAPAROSCOPY  2016   dx'd with endometriosis per patient--[place], [place].  Op report indicates lysis of adhesions.   . TONSILLECTOMY      Current Outpatient Medications  Medication Sig Dispense Refill  . dicyclomine (BENTYL) 10 MG capsule Take 10 mg by mouth 4 (four) times daily -   before meals and at bedtime.    Marland Kitchen esomeprazole (NEXIUM) 20 MG packet Take 20 mg by mouth daily before breakfast.    . hydrOXYzine (ATARAX/VISTARIL) 25 MG tablet Take 25 mg by mouth as needed.    . ondansetron (ZOFRAN) 4 MG tablet Take 4 mg by mouth at bedtime.    . Vitamin D, Ergocalciferol, (DRISDOL) 1.25 MG (50000 UNIT) CAPS capsule Take 1 capsule (50,000 Units total) by mouth every 7 (seven) days. 12 capsule 0   No current facility-administered medications for this visit.     ALLERGIES: Hydrocodone, Macrobid [nitrofurantoin], and Sulfa antibiotics  Family History  Problem Relation Age of Onset  . Diabetes Mother   . Cancer Maternal Grandmother 75       Dec from colon cancer  . Diabetes Maternal Grandfather   . Stroke Maternal Grandfather   . Stroke Maternal Aunt     Social History   Socioeconomic History  . Marital status: Single    Spouse name: Not on file  . Number of children: Not on file  . Years of education: Not on file  . Highest education level: Not on file  Occupational History  . Not on file  Tobacco Use  . Smoking status: Never Smoker  . Smokeless tobacco: Never Used  Vaping Use  . Vaping Use: Never used  Substance and Sexual Activity  . Alcohol use: Not Currently  . Drug use: Yes    Frequency: 1.0 times per week    Types: Marijuana    Comment: uses for nausea  . Sexual activity: Not Currently    Birth control/protection: I.U.D.    Comment: Mirena IUD 05/2017  Other Topics Concern  . Not on file  Social History Narrative  . Not on file   Social Determinants of Health   Financial Resource Strain:   . Difficulty of Paying Living Expenses:   Food Insecurity:   . Worried About Programme researcher, broadcasting/film/video in the Last Year:   . Barista in the Last Year:   Transportation Needs:   . Freight forwarder (Medical):   Marland Kitchen Lack of Transportation (Non-Medical):   Physical Activity:   . Days of Exercise per Week:   . Minutes of Exercise per Session:    Stress:   . Feeling of Stress :   Social Connections:   . Frequency of Communication with Friends and Family:   . Frequency of Social Gatherings with Friends and Family:   . Attends Religious Services:   . Active Member of Clubs or Organizations:   . Attends Banker Meetings:   Marland Kitchen Marital Status:   Intimate Partner Violence:   . Fear of Current or Ex-Partner:   . Emotionally Abused:   Marland Kitchen Physically Abused:   . Sexually Abused:     Review of Systems  All other systems reviewed and are negative.   PHYSICAL EXAMINATION:    BP (!) 100/60   Pulse 84   Ht 5' 6.5" (1.689 m)   Wt 148 lb (67.1 kg)   BMI 23.53 kg/m     General appearance: alert, cooperative and appears stated age Head: Normocephalic, without obvious abnormality, atraumatic Neck: no adenopathy, supple, symmetrical, trachea midline and thyroid normal to inspection and palpation Lungs: clear to auscultation bilaterally Heart: regular rate and rhythm Abdomen: soft, non-tender, no masses,  no organomegaly Extremities: extremities normal, atraumatic, no cyanosis or edema Skin: Skin color, texture, turgor normal. No rashes or lesions Lymph nodes: Cervical, supraclavicular, and axillary nodes normal. No abnormal inguinal nodes palpated Neurologic: Grossly normal  Pelvic: External genitalia:  no lesions              Urethra:  normal appearing urethra with no masses, tenderness or lesions              Bartholins and Skenes: normal                 Vagina: normal appearing vagina with normal color and discharge, no lesions              Cervix: no lesions.  IUD strings noted.                Bimanual Exam:  Uterus:  normal size, contour, position, consistency, mobility, non-tender              Adnexa: no mass, fullness, tenderness            Chaperone was present for exam.  ASSESSMENT  HGSIL extending into the endocervical glands.  Mirena IUD.  Anxiety. Pelvic pain. Pain with bowel movements. Chronic  nausea.  PLAN  Comprehensive discussion of HPV, abnormal paps, colposcopy, and LEEP.  I am recommending  LEEP.  We discussed risks, benefits, and alternatives to LEEP. Risks include but are not limited to bleeding, infection, damage to surrounding organs, increased risk of preterm labor and delivery, inability to cure HPV, and need for further treatment.  We will proceed at [facility]. Referral back to GI for chronic nausea and pelvic pain and pain with BMs.  __31____ minutes consultation.

## 2019-12-18 DIAGNOSIS — Z87410 Personal history of cervical dysplasia: Secondary | ICD-10-CM

## 2019-12-18 HISTORY — DX: Personal history of cervical dysplasia: Z87.410

## 2019-12-20 ENCOUNTER — Encounter (HOSPITAL_BASED_OUTPATIENT_CLINIC_OR_DEPARTMENT_OTHER): Payer: Self-pay | Admitting: Obstetrics and Gynecology

## 2019-12-21 ENCOUNTER — Other Ambulatory Visit: Payer: Self-pay

## 2019-12-21 ENCOUNTER — Encounter (HOSPITAL_BASED_OUTPATIENT_CLINIC_OR_DEPARTMENT_OTHER): Payer: Self-pay | Admitting: Obstetrics and Gynecology

## 2019-12-21 NOTE — Progress Notes (Addendum)
Spoke w/ via phone for pre-op interview---PT Lab needs dos----urine poct               Lab results------none COVID test ------12-23-2019 at 315 pm Arrive at -------530 am 12-27-2019 NPO after MN NO Solid Food.  Clear liquids from MN until---430 am 12-27-2019 then npo Medications to take morning of surgery -----hydroxyazine prn, nexium Diabetic medication -----n/a Patient Special Instructions -----none Pre-Op special Istructions -----none Patient verbalized understanding of instructions that were given at this phone interview. Patient denies shortness of breath, chest pain, fever, cough at this phone interview.

## 2019-12-22 ENCOUNTER — Telehealth: Payer: Self-pay | Admitting: Obstetrics and Gynecology

## 2019-12-22 NOTE — Telephone Encounter (Signed)
Spoke with patient. Patient crying. Patient is scheduled for LEEP ay [facility] on 12/27/19. Patient reports increased anxiety, heart pounding and nausea. Has not eaten in 3 days. Is taking zofran prn prescribed by GI, provides little relief of nausea. Patient denies any other life stressors, is unsure if anxiety is related to upcoming surgery. She has contacted her PCP and scheduled for first available appt on 01/04/20 with the PA. Patient is scheduled for her Covid testing on Friday at 3:15pm. She is concerned about not being able to eat prior to surgery. Denies any other symptoms.   Advised patient I will update Dr. Edward Jolly and return call with recommendations, patient agreeable.   Dr. Edward Jolly -please review and advise.

## 2019-12-22 NOTE — Telephone Encounter (Signed)
I recommend patient see a PCP for evaluation and treatment of her anxiety. She can see either her own PCP or go to urgent care/ER if her anxiety is significant.

## 2019-12-22 NOTE — Telephone Encounter (Signed)
Patient is having anxiety before surgery and has not eaten in three days.

## 2019-12-23 ENCOUNTER — Other Ambulatory Visit (HOSPITAL_COMMUNITY)
Admission: RE | Admit: 2019-12-23 | Discharge: 2019-12-23 | Disposition: A | Discharge status: Home / Self Care | Payer: BC Managed Care – PPO | Source: Ambulatory Visit | Attending: Obstetrics and Gynecology | Admitting: Obstetrics and Gynecology

## 2019-12-23 DIAGNOSIS — Z20822 Contact with and (suspected) exposure to covid-19: Secondary | ICD-10-CM | POA: Diagnosis not present

## 2019-12-23 DIAGNOSIS — Z01812 Encounter for preprocedural laboratory examination: Secondary | ICD-10-CM | POA: Insufficient documentation

## 2019-12-23 LAB — SARS CORONAVIRUS 2 (TAT 6-24 HRS): SARS Coronavirus 2: NEGATIVE

## 2019-12-23 NOTE — Telephone Encounter (Signed)
Spoke with patient. Advised per Dr. Edward Jolly.  Patients PCP is Roselyn Meier, [place] at [facility]. Patient reports no earlier appt available.  Denies SI/HI. Denies any new or worsening symptoms.  Reviewed with patient option of [facility],contact information provided. Advised patient to consider evaluation prior to her Covid 19 testing scheduled for this afternoon, since she will need to self quarantine.   [address] [place], [place] [address] Call: [phone] Monday-Friday: Noon-7:45pm Saturday/Sunday: 9:00am-2:45pm  Patient verbalizes understanding and is agreeable.   Routing to provider for final review. Patient is agreeable to disposition. Will close encounter.

## 2019-12-26 NOTE — Anesthesia Preprocedure Evaluation (Addendum)
Anesthesia Evaluation  Patient identified by MRN, date of birth, ID band Patient awake    Reviewed: Allergy & Precautions, NPO status , Patient's Chart, lab work & pertinent test results  History of Anesthesia Complications (+) PONV and history of anesthetic complications  Airway Mallampati: II  TM Distance: >3 FB Neck ROM: Full    Dental  (+) Missing, Chipped,    Pulmonary neg pulmonary ROS,    Pulmonary exam normal        Cardiovascular negative cardio ROS Normal cardiovascular exam     Neuro/Psych  Headaches, Anxiety Depression    GI/Hepatic Neg liver ROS, GERD  Medicated and Controlled,  Endo/Other  negative endocrine ROS  Renal/GU negative Renal ROS  negative genitourinary   Musculoskeletal negative musculoskeletal ROS (+)   Abdominal   Peds  Hematology negative hematology ROS (+)   Anesthesia Other Findings Day of surgery medications reviewed with patient.  Reproductive/Obstetrics                            Anesthesia Physical Anesthesia Plan  ASA: II  Anesthesia Plan: General   Post-op Pain Management:    Induction: Intravenous  PONV Risk Score and Plan: 4 or greater and Treatment may vary due to age or medical condition, Ondansetron, Dexamethasone, Midazolam, Scopolamine patch - Pre-op, Propofol infusion and TIVA  Airway Management Planned: LMA  Additional Equipment: None  Intra-op Plan:   Post-operative Plan: Extubation in OR  Informed Consent: I have reviewed the patients History and Physical, chart, labs and discussed the procedure including the risks, benefits and alternatives for the proposed anesthesia with the patient or authorized representative who has indicated his/her understanding and acceptance.     Dental advisory given  Plan Discussed with: CRNA  Anesthesia Plan Comments:        Anesthesia Quick Evaluation

## 2019-12-27 ENCOUNTER — Ambulatory Visit (HOSPITAL_BASED_OUTPATIENT_CLINIC_OR_DEPARTMENT_OTHER)
Admission: RE | Admit: 2019-12-27 | Discharge: 2019-12-27 | Disposition: A | Discharge status: Home / Self Care | Payer: BC Managed Care – PPO | Attending: Obstetrics and Gynecology | Admitting: Obstetrics and Gynecology

## 2019-12-27 ENCOUNTER — Encounter (HOSPITAL_BASED_OUTPATIENT_CLINIC_OR_DEPARTMENT_OTHER): Payer: Self-pay | Admitting: Obstetrics and Gynecology

## 2019-12-27 ENCOUNTER — Other Ambulatory Visit: Payer: Self-pay

## 2019-12-27 ENCOUNTER — Ambulatory Visit (HOSPITAL_BASED_OUTPATIENT_CLINIC_OR_DEPARTMENT_OTHER): Payer: BC Managed Care – PPO | Admitting: Anesthesiology

## 2019-12-27 ENCOUNTER — Encounter (HOSPITAL_BASED_OUTPATIENT_CLINIC_OR_DEPARTMENT_OTHER)
Admission: RE | Disposition: A | Discharge status: Home / Self Care | Payer: Self-pay | Source: Home / Self Care | Attending: Obstetrics and Gynecology

## 2019-12-27 DIAGNOSIS — Z79899 Other long term (current) drug therapy: Secondary | ICD-10-CM | POA: Diagnosis not present

## 2019-12-27 DIAGNOSIS — F419 Anxiety disorder, unspecified: Secondary | ICD-10-CM | POA: Diagnosis not present

## 2019-12-27 DIAGNOSIS — Z881 Allergy status to other antibiotic agents status: Secondary | ICD-10-CM | POA: Insufficient documentation

## 2019-12-27 DIAGNOSIS — F418 Other specified anxiety disorders: Secondary | ICD-10-CM | POA: Diagnosis not present

## 2019-12-27 DIAGNOSIS — N871 Moderate cervical dysplasia: Secondary | ICD-10-CM | POA: Insufficient documentation

## 2019-12-27 DIAGNOSIS — Z975 Presence of (intrauterine) contraceptive device: Secondary | ICD-10-CM | POA: Diagnosis not present

## 2019-12-27 DIAGNOSIS — K219 Gastro-esophageal reflux disease without esophagitis: Secondary | ICD-10-CM | POA: Diagnosis not present

## 2019-12-27 DIAGNOSIS — Z885 Allergy status to narcotic agent status: Secondary | ICD-10-CM | POA: Diagnosis not present

## 2019-12-27 DIAGNOSIS — Z882 Allergy status to sulfonamides status: Secondary | ICD-10-CM | POA: Diagnosis not present

## 2019-12-27 DIAGNOSIS — R87613 High grade squamous intraepithelial lesion on cytologic smear of cervix (HGSIL): Secondary | ICD-10-CM | POA: Diagnosis not present

## 2019-12-27 HISTORY — PX: LEEP: SHX91

## 2019-12-27 HISTORY — DX: Presence of (intrauterine) contraceptive device: Z97.5

## 2019-12-27 HISTORY — DX: Personal history of other diseases of the female genital tract: Z87.42

## 2019-12-27 HISTORY — DX: Gastro-esophageal reflux disease without esophagitis: K21.9

## 2019-12-27 HISTORY — DX: Other specified postprocedural states: R11.2

## 2019-12-27 HISTORY — PX: COLPOSCOPY: SHX161

## 2019-12-27 HISTORY — DX: Other specified postprocedural states: Z98.890

## 2019-12-27 LAB — POCT PREGNANCY, URINE: Preg Test, Ur: NEGATIVE

## 2019-12-27 SURGERY — LEEP (LOOP ELECTROSURGICAL EXCISION PROCEDURE)
Anesthesia: General | Site: Vagina

## 2019-12-27 MED ORDER — ACETAMINOPHEN 500 MG PO TABS
1000.0000 mg | ORAL_TABLET | Freq: Once | ORAL | Status: AC
  Administered 2019-12-27: 1000 mg via ORAL

## 2019-12-27 MED ORDER — LIDOCAINE-EPINEPHRINE 1 %-1:100000 IJ SOLN
INTRAMUSCULAR | Status: DC | PRN
  Administered 2019-12-27: 10 mL

## 2019-12-27 MED ORDER — PROPOFOL 10 MG/ML IV BOLUS
INTRAVENOUS | Status: DC | PRN
  Administered 2019-12-27: 140 mg via INTRAVENOUS

## 2019-12-27 MED ORDER — OXYCODONE HCL 5 MG/5ML PO SOLN: 5.0000 mg | Freq: Once | ORAL | Status: DC | PRN

## 2019-12-27 MED ORDER — DEXAMETHASONE SODIUM PHOSPHATE 10 MG/ML IJ SOLN
INTRAMUSCULAR | Status: AC
  Filled 2019-12-27: qty 1

## 2019-12-27 MED ORDER — ACETAMINOPHEN 10 MG/ML IV SOLN
INTRAVENOUS | Status: AC
  Filled 2019-12-27: qty 100

## 2019-12-27 MED ORDER — ONDANSETRON HCL 4 MG/2ML IJ SOLN
INTRAMUSCULAR | Status: DC | PRN
  Administered 2019-12-27: 4 mg via INTRAVENOUS

## 2019-12-27 MED ORDER — ONDANSETRON HCL 4 MG/2ML IJ SOLN
INTRAMUSCULAR | Status: AC
  Filled 2019-12-27: qty 2

## 2019-12-27 MED ORDER — FERRIC SUBSULFATE SOLN
Status: DC | PRN
  Administered 2019-12-27: 1

## 2019-12-27 MED ORDER — PROPOFOL 500 MG/50ML IV EMUL
INTRAVENOUS | Status: DC | PRN
  Administered 2019-12-27: 100 ug/kg/min via INTRAVENOUS

## 2019-12-27 MED ORDER — KETOROLAC TROMETHAMINE 30 MG/ML IJ SOLN
INTRAMUSCULAR | Status: AC
  Filled 2019-12-27: qty 1

## 2019-12-27 MED ORDER — IODINE STRONG (LUGOLS) 5 % PO SOLN
ORAL | Status: DC | PRN
  Administered 2019-12-27: 0.1 mL via ORAL

## 2019-12-27 MED ORDER — DEXAMETHASONE SODIUM PHOSPHATE 4 MG/ML IJ SOLN
INTRAMUSCULAR | Status: DC | PRN
  Administered 2019-12-27: 5 mg via INTRAVENOUS

## 2019-12-27 MED ORDER — PROPOFOL 500 MG/50ML IV EMUL
INTRAVENOUS | Status: AC
  Filled 2019-12-27: qty 50

## 2019-12-27 MED ORDER — LACTATED RINGERS IV SOLN: INTRAVENOUS | Status: DC

## 2019-12-27 MED ORDER — PROPOFOL 10 MG/ML IV BOLUS
INTRAVENOUS | Status: AC
  Filled 2019-12-27: qty 40

## 2019-12-27 MED ORDER — SCOPOLAMINE 1 MG/3DAYS TD PT72
MEDICATED_PATCH | TRANSDERMAL | Status: AC
  Filled 2019-12-27: qty 1

## 2019-12-27 MED ORDER — SCOPOLAMINE 1 MG/3DAYS TD PT72
1.0000 | MEDICATED_PATCH | Freq: Once | TRANSDERMAL | Status: DC
  Administered 2019-12-27: 1.5 mg via TRANSDERMAL

## 2019-12-27 MED ORDER — ACETIC ACID 4% SOLUTION
Status: DC | PRN
  Administered 2019-12-27: 1 via TOPICAL

## 2019-12-27 MED ORDER — FENTANYL CITRATE (PF) 100 MCG/2ML IJ SOLN
INTRAMUSCULAR | Status: DC | PRN
  Administered 2019-12-27: 50 ug via INTRAVENOUS
  Administered 2019-12-27 (×2): 25 ug via INTRAVENOUS

## 2019-12-27 MED ORDER — OXYCODONE HCL 5 MG PO TABS: 5.0000 mg | ORAL_TABLET | Freq: Once | ORAL | Status: DC | PRN

## 2019-12-27 MED ORDER — LIDOCAINE HCL (CARDIAC) PF 100 MG/5ML IV SOSY
PREFILLED_SYRINGE | INTRAVENOUS | Status: DC | PRN
  Administered 2019-12-27 (×2): 40 mg via INTRAVENOUS

## 2019-12-27 MED ORDER — FENTANYL CITRATE (PF) 100 MCG/2ML IJ SOLN: 25.0000 ug | INTRAMUSCULAR | Status: DC | PRN

## 2019-12-27 MED ORDER — MIDAZOLAM HCL 2 MG/2ML IJ SOLN
INTRAMUSCULAR | Status: AC
  Filled 2019-12-27: qty 2

## 2019-12-27 MED ORDER — PROMETHAZINE HCL 25 MG/ML IJ SOLN: 6.2500 mg | INTRAMUSCULAR | Status: DC | PRN

## 2019-12-27 MED ORDER — MIDAZOLAM HCL 5 MG/5ML IJ SOLN
INTRAMUSCULAR | Status: DC | PRN
  Administered 2019-12-27: 2 mg via INTRAVENOUS

## 2019-12-27 MED ORDER — LIDOCAINE 2% (20 MG/ML) 5 ML SYRINGE
INTRAMUSCULAR | Status: AC
  Filled 2019-12-27: qty 5

## 2019-12-27 MED ORDER — FENTANYL CITRATE (PF) 100 MCG/2ML IJ SOLN
INTRAMUSCULAR | Status: AC
  Filled 2019-12-27: qty 2

## 2019-12-27 SURGICAL SUPPLY — 38 items
APL SWBSTK 6 STRL LF DISP (MISCELLANEOUS) ×2
APPLICATOR COTTON TIP 6 STRL (MISCELLANEOUS) ×2 IMPLANT
APPLICATOR COTTON TIP 6IN STRL (MISCELLANEOUS) ×6
BLADE SURG 15 STRL LF DISP TIS (BLADE) IMPLANT
BLADE SURG 15 STRL SS (BLADE)
CATH ROBINSON RED A/P 16FR (CATHETERS) ×3 IMPLANT
COVER WAND RF STERILE (DRAPES) ×3 IMPLANT
ELECT BALL LEEP 3MM BLK (ELECTRODE) IMPLANT
ELECT BALL LEEP 5MM RED (ELECTRODE) ×3 IMPLANT
ELECT LEEP 2.0X0.8 R2008 (MISCELLANEOUS)
ELECT LLETZ BALL 5MM DISP (ELECTRODE) IMPLANT
ELECT LOOP LEEP RND 10X10 YLW (CUTTING LOOP)
ELECT LOOP LEEP RND 15X12 GRN (CUTTING LOOP) ×3
ELECT LOOP LEEP RND 20X12 WHT (CUTTING LOOP) ×3
ELECT LOOP LEEP SQR 10X10 ORG (CUTTING LOOP) ×3
ELECT REM PT RETURN 9FT ADLT (ELECTROSURGICAL) ×3
ELECTRODE LEEP 2.0X0.8 R2008 (MISCELLANEOUS) IMPLANT
ELECTRODE LOOP LP RND 10X10YLW (CUTTING LOOP) IMPLANT
ELECTRODE LOOP LP RND 15X12GRN (CUTTING LOOP) ×1 IMPLANT
ELECTRODE LOOP LP RND 20X12WHT (CUTTING LOOP) ×1 IMPLANT
ELECTRODE LOOP LP SQR 10X10ORG (CUTTING LOOP) ×1 IMPLANT
ELECTRODE REM PT RTRN 9FT ADLT (ELECTROSURGICAL) ×1 IMPLANT
EXTENDER ELECT LOOP LEEP 10CM (CUTTING LOOP) IMPLANT
GLOVE BIO SURGEON STRL SZ 6.5 (GLOVE) ×2 IMPLANT
GLOVE BIO SURGEONS STRL SZ 6.5 (GLOVE) ×1
GOWN STRL REUS W/TWL LRG LVL3 (GOWN DISPOSABLE) ×3 IMPLANT
NS IRRIG 1000ML POUR BTL (IV SOLUTION) ×3 IMPLANT
PACK VAGINAL WOMENS (CUSTOM PROCEDURE TRAY) ×3 IMPLANT
PAD OB MATERNITY 4.3X12.25 (PERSONAL CARE ITEMS) ×3 IMPLANT
SCOPETTES 8  STERILE (MISCELLANEOUS) ×4
SCOPETTES 8 STERILE (MISCELLANEOUS) ×2 IMPLANT
SUT SILK 2 0 SH (SUTURE) ×3 IMPLANT
SUT VIC AB 2-0 SH 27 (SUTURE)
SUT VIC AB 2-0 SH 27XBRD (SUTURE) IMPLANT
TOWEL OR 17X26 10 PK STRL BLUE (TOWEL DISPOSABLE) ×6 IMPLANT
TUBE CONNECTING 12'X1/4 (SUCTIONS) ×1
TUBE CONNECTING 12X1/4 (SUCTIONS) ×2 IMPLANT
VACUUM HOSE/TUBING 7/8INX6FT (MISCELLANEOUS) ×3 IMPLANT

## 2019-12-27 NOTE — H&P (Signed)
Office Visit  12/14/2019 [facility]   Debbie Warner, Debbie Him, MD Obstetrics and Gynecology  High grade squamous intraepithelial cervical dysplasia +3 more Dx  Surgical consult  Reason for Visit  Additional Documentation  Vitals:  BP 100/60 Important    Pulse 84  Ht 5' 6.5" (1.689 m)  Wt 67.1 kg  BMI 23.53 kg/m  BSA 1.77 m  Flowsheets:  NEWS,  MEWS Score,  Anthropometrics,  Method of Visit    Encounter Info:  Billing Info,  History,  Allergies,  Detailed Report    All Notes   Progress Notes by Patton Salles, MD at 12/14/2019 4:00 PM Author: Patton Salles, MD Author Type: Physician Filed: 12/16/2019  8:32 AM  Note Status: Signed Cosign: Cosign Not Required Encounter Date: 12/14/2019  Editor: Patton Salles, MD (Physician)      Prior Versions: 1. Alphonsa Overall, CMA (Certified Medical Assistant) at 12/14/2019  4:05 PM - Sign when Signing Visit      GYNECOLOGY  VISIT   HPI: 24 y.o.   Single  Caucasian  female   G0P0000 with No LMP recorded. (Menstrual status: IUD).   here for surgical consult.   Pap showed LGSIL possible HGSIL, positive HR HPV. Colposcopy showed HGSIL, CIN II with endocervical glandular extension.   Patient has anxiety with procedures.    Smokes marijuana to relieve nausea.   She is still having lower pelvic pain, and she had a normal pelvic ultrasound.  She has pain associated with bowel movements.  She has a gastroenterologist. No coloscopy to date. She states she needs a referral to see her GI again.    Has received all gardasil injections.    GYNECOLOGIC HISTORY: No LMP recorded. (Menstrual status: IUD). Contraception:  Mirena IUD 05/2017 Menopausal hormone therapy:  n/a Last mammogram:  n/a Last pap smear: 10-31-19 LGSIL:Pos HR HPV--colpo revealed HGSIL--LEEP scheduled 12-27-19, 11/2018 abnormal per pt                OB History     Gravida  0   Para  0    Term  0   Preterm  0   AB  0   Living  0      SAB  0   TAB  0   Ectopic  0   Multiple  0   Live Births  0                 There are no problems to display for this patient.         Past Medical History:  Diagnosis Date  . Abnormal Pap smear of cervix      Hx of colposcopy  . ADHD    . Anxiety    . Depression    . Dysmenorrhea    . Endometriosis    . High grade squamous intraepithelial cervical dysplasia 11/2019    Endocervical glandular extension  . History of suicidal tendencies 2019  . Hives      Hx of "anxiety Hives"--takes hydroxyzine  . IBS (irritable bowel syndrome)             Past Surgical History:  Procedure Laterality Date  . fractured arm Right 2008  . PELVIC LAPAROSCOPY   2016    dx'd with endometriosis per patient--[place].  Op report indicates lysis of adhesions.   . TONSILLECTOMY  Current Outpatient Medications  Medication Sig Dispense Refill  . dicyclomine (BENTYL) 10 MG capsule Take 10 mg by mouth 4 (four) times daily -  before meals and at bedtime.      Marland Kitchen esomeprazole (NEXIUM) 20 MG packet Take 20 mg by mouth daily before breakfast.      . hydrOXYzine (ATARAX/VISTARIL) 25 MG tablet Take 25 mg by mouth as needed.      . ondansetron (ZOFRAN) 4 MG tablet Take 4 mg by mouth at bedtime.      . Vitamin D, Ergocalciferol, (DRISDOL) 1.25 MG (50000 UNIT) CAPS capsule Take 1 capsule (50,000 Units total) by mouth every 7 (seven) days. 12 capsule 0    No current facility-administered medications for this visit.      ALLERGIES: Hydrocodone, Macrobid [nitrofurantoin], and Sulfa antibiotics        Family History  Problem Relation Age of Onset  . Diabetes Mother    . Cancer Maternal Grandmother 75        Dec from colon cancer  . Diabetes Maternal Grandfather    . Stroke Maternal Grandfather    . Stroke Maternal Aunt        Social History         Socioeconomic History  . Marital status: Single      Spouse name: Not  on file  . Number of children: Not on file  . Years of education: Not on file  . Highest education level: Not on file  Occupational History  . Not on file  Tobacco Use  . Smoking status: Never Smoker  . Smokeless tobacco: Never Used  Vaping Use  . Vaping Use: Never used  Substance and Sexual Activity  . Alcohol use: Not Currently  . Drug use: Yes      Frequency: 1.0 times per week      Types: Marijuana      Comment: uses for nausea  . Sexual activity: Not Currently      Birth control/protection: I.U.D.      Comment: Mirena IUD 05/2017  Other Topics Concern  . Not on file  Social History Narrative  . Not on file    Social Determinants of Health       Financial Resource Strain:   . Difficulty of Paying Living Expenses:   Food Insecurity:   . Worried About Programme researcher, broadcasting/film/video in the Last Year:   . Barista in the Last Year:   Transportation Needs:   . Freight forwarder (Medical):   Marland Kitchen Lack of Transportation (Non-Medical):   Physical Activity:   . Days of Exercise per Week:   . Minutes of Exercise per Session:   Stress:   . Feeling of Stress :   Social Connections:   . Frequency of Communication with Friends and Family:   . Frequency of Social Gatherings with Friends and Family:   . Attends Religious Services:   . Active Member of Clubs or Organizations:   . Attends Banker Meetings:   Marland Kitchen Marital Status:   Intimate Partner Violence:   . Fear of Current or Ex-Partner:   . Emotionally Abused:   Marland Kitchen Physically Abused:   . Sexually Abused:       Review of Systems  All other systems reviewed and are negative.     PHYSICAL EXAMINATION:     BP (!) 100/60   Pulse 84   Ht 5' 6.5" (1.689 m)   Wt 148 lb (67.1 kg)  BMI 23.53 kg/m     General appearance: alert, cooperative and appears stated age Head: Normocephalic, without obvious abnormality, atraumatic Neck: no adenopathy, supple, symmetrical, trachea midline and thyroid normal to inspection  and palpation Lungs: clear to auscultation bilaterally Heart: regular rate and rhythm Abdomen: soft, non-tender, no masses,  no organomegaly Extremities: extremities normal, atraumatic, no cyanosis or edema Skin: Skin color, texture, turgor normal. No rashes or lesions Lymph nodes: Cervical, supraclavicular, and axillary nodes normal. No abnormal inguinal nodes palpated Neurologic: Grossly normal   Pelvic: External genitalia:  no lesions              Urethra:  normal appearing urethra with no masses, tenderness or lesions              Bartholins and Skenes: normal                 Vagina: normal appearing vagina with normal color and discharge, no lesions              Cervix: no lesions.  IUD strings noted.                Bimanual Exam:  Uterus:  normal size, contour, position, consistency, mobility, non-tender              Adnexa: no mass, fullness, tenderness             Chaperone was present for exam.   ASSESSMENT   HGSIL extending into the endocervical glands.  Mirena IUD.  Anxiety. Pelvic pain. Pain with bowel movements. Chronic nausea.   PLAN   Comprehensive discussion of HPV, abnormal paps, colposcopy, and LEEP.  I am recommending LEEP.  We discussed risks, benefits, and alternatives to LEEP. Risks include but are not limited to bleeding, infection, damage to surrounding organs, increased risk of preterm labor and delivery, inability to cure HPV, and need for further treatment.  We will proceed at [facility]. Referral back to GI for chronic nausea and pelvic pain and pain with BMs.   __31____ minutes consultation.

## 2019-12-27 NOTE — Progress Notes (Signed)
Update to History and Physical  Has appointment to see her PCP for anxiety.  Feels ready for surgery today.   Patient examined.   OK to proceed with surgery.   I did discuss the need to cut her IUD strings short in order to be able to perform the LEEP today.  She understands this may result in US guided IUD removal in the future or hysteroscopic IUD removal.

## 2019-12-27 NOTE — Anesthesia Postprocedure Evaluation (Signed)
Anesthesia Post Note  Patient: Debbie Warner  Procedure(s) Performed: LOOP ELECTROSURGICAL EXCISION PROCEDURE (LEEP)/ECC (N/A Vagina ) COLPOSCOPY (N/A Vagina )     Patient location during evaluation: PACU Anesthesia Type: General Level of consciousness: awake and alert and oriented Pain management: pain level controlled Vital Signs Assessment: post-procedure vital signs reviewed and stable Respiratory status: spontaneous breathing, nonlabored ventilation and respiratory function stable Cardiovascular status: blood pressure returned to baseline Postop Assessment: no apparent nausea or vomiting Anesthetic complications: no   No complications documented.  Last Vitals:  Vitals:   12/27/19 0845 12/27/19 0900  BP: 108/77 119/84  Pulse: 96 89  Resp: 18 12  Temp:    SpO2: 100% 99%    Last Pain:  Vitals:   12/27/19 0900  TempSrc:   PainSc: 0-No pain                 Kaylyn Layer

## 2019-12-27 NOTE — Discharge Instructions (Signed)
°  Post Anesthesia Home Care Instructions  Activity: Get plenty of rest for the remainder of the day. A responsible individual must stay with you for 24 hours following the procedure.  For the next 24 hours, DO NOT: -Drive a car -Advertising copywriter -Drink alcoholic beverages -Take any medication unless instructed by your physician -Make any legal decisions or sign important papers.  Meals: Start with liquid foods such as gelatin or soup. Progress to regular foods as tolerated. Avoid greasy, spicy, heavy foods. If nausea and/or vomiting occur, drink only clear liquids until the nausea and/or vomiting subsides. Call your physician if vomiting continues.  Special Instructions/Symptoms: Your throat may feel dry or sore from the anesthesia or the breathing tube placed in your throat during surgery. If this causes discomfort, gargle with warm salt water. The discomfort should disappear within 24 hours.  If you had a scopolamine patch placed behind your ear for the management of post- operative nausea and/or vomiting:  1. The medication in the patch is effective for 72 hours, after which it should be removed.  Wrap patch in a tissue and discard in the trash. Wash hands thoroughly with soap and water. 2. You may remove the patch earlier than 72 hours if you experience unpleasant side effects which may include dry mouth, dizziness or visual disturbances. 3. Avoid touching the patch. Wash your hands with soap and water after contact with the patch.     Do not take any Tylenol until after 1:30 pm today.

## 2019-12-27 NOTE — Op Note (Signed)
OPERATIVE REPORT  PRE-OP DIAGNOSIS:  High grade cervical dysplasia  POST OP DIAGNOSIS:  High grade cervical dysplasia  PROCEDURE:  Colposcopy with LEEP conization and ECC  SURGEON:  Janean Sark, MD  ANESTHESIA:  LMA, local with 10 cc 1% lidocaine with epinepherine 1:100,000  IVF:    URINE OUTPUT:  100 cc prior to procedure  EBL:   5 cc  COMPLICATIONS:  None.   INDICATIONS FOR PROCEDURE:    The patient is a 24 year old G0 Caucasian female with a Mirena IUD who presents with a pap showing LGSIL, possible HGSIL and positive HR HPV.   A colposcopy was satisfactory and demonstrated HGSIL, CIN II with endocervical glandular extension.  A plan is made to proceed with a LEEP procedure and ECC in an outpatient hospital setting.  Risks, benefits, and alternatives were reviewed with the patient who wished to proceed.   FINDINGS:     Colposcopy was satisfactory and demonstrated acetowhite thickening and mosaics from 9 - 12:00.   No vagina lesions were noted.  Lugols demonstrated some decreased uptake in a perimeter around the entire cervix.   SPECIMENS:  The LEEP specimen was marked at 12:00 with silk suture and sent to pathology. The endocervical button was marked at 12:00 with silk suture and sent to pathology.  The ECC was also sent to pathology.   PROCEDURE:  The patient was reidentified in the preop hold area.  In the operating room, she received an LMA anesthetic.  She was placed in the dorsal lithology position with [employer] stirrups.  An exam under anesthesia was performed.  A speculum was placed in the vagina. 5% acetic acid was used on the cervix and colposcopy was performed.  See the findings above.   The bladder was catheterized sterilely of urine.   The insulated speculum was placed in the vagina.  The IUD strings were trimmed short.  Lugols was then placed on the cervix to outline the treatment area.   The cervix was injected with 10 cc 1% lidocaine with  epinepherine 1:100,000.  The LEEP was performed with a cutting setting of 50 watts.  The specimen was later marked at the 12:00 position and sent to pathology. An endocervical pass was performed also with a cutting setting of 50 watts.  This specimen was later marked at the 12:00 position and sent to pathology.  The ECC was performed with the Kevorkian curette and sent to pathology.    The cervix was coagulated with the cautery ball on a setting of 40 watts, and  the endocervix was avoided. Hemostasis was good.  Monsel's solution was placed over the treatment area.   There were no complications to the procedure.  All needle, instrument, and sponge counts were correct.  The patient was escorted to the recovery room in stable and awake condition.   SIGNED:  Janean Sark, MD

## 2019-12-27 NOTE — Transfer of Care (Signed)
Immediate Anesthesia Transfer of Care Note  Patient: Debbie Warner  Procedure(s) Performed: Procedure(s) (LRB): LOOP ELECTROSURGICAL EXCISION PROCEDURE (LEEP)/ECC (N/A) COLPOSCOPY (N/A)  Patient Location: PACU  Anesthesia Type: General  Level of Consciousness: awake, sedated, patient cooperative and responds to stimulation  Airway & Oxygen Therapy: Patient Spontanous Breathing and Patient connected to [place] 02 and soft FM   Post-op Assessment: Report given to PACU RN, Post -op Vital signs reviewed and stable and Patient moving all extremities  Post vital signs: Reviewed and stable  Complications: No apparent anesthesia complications

## 2019-12-27 NOTE — Anesthesia Procedure Notes (Signed)
Procedure Name: LMA Insertion Date/Time: 12/27/2019 7:38 AM Performed by: Jessica Priest, CRNA Pre-anesthesia Checklist: Patient identified, Emergency Drugs available, Suction available, Patient being monitored and Timeout performed Patient Re-evaluated:Patient Re-evaluated prior to induction Oxygen Delivery Method: Circle system utilized Preoxygenation: Pre-oxygenation with 100% oxygen Induction Type: IV induction Ventilation: Mask ventilation without difficulty LMA: LMA inserted LMA Size: 3.0 Number of attempts: 1 Airway Equipment and Method: Bite block Placement Confirmation: positive ETCO2,  breath sounds checked- equal and bilateral and CO2 detector Tube secured with: Tape Dental Injury: Teeth and Oropharynx as per pre-operative assessment

## 2019-12-28 ENCOUNTER — Encounter (HOSPITAL_BASED_OUTPATIENT_CLINIC_OR_DEPARTMENT_OTHER): Payer: Self-pay | Admitting: Obstetrics and Gynecology

## 2019-12-29 LAB — SURGICAL PATHOLOGY

## 2020-01-02 ENCOUNTER — Telehealth: Payer: Self-pay

## 2020-01-02 NOTE — Telephone Encounter (Signed)
Light bleeding is expected, sometimes this increases at the 2 week post op mark.  Please be sure she is not placing anything in the vagina - no tampons, vaginal medications, or sexual activity.  I will see her for her 4 week check, unless she needs something sooner.

## 2020-01-02 NOTE — Telephone Encounter (Signed)
Patient is calling in regards to post op bleeding.

## 2020-01-02 NOTE — Telephone Encounter (Signed)
Pt states post procedure of LEEP/colpo in OR on 12/27/19. Spoke with pt. Pt states had light pink bleeding today. Pt states placed pad and is not soaking and only changing as needed. Pt wanted to make sure what to expect since didn't have any bleeding since procedure. Pt advised normal from procedure and to monitor for now. Pt advised heavy bleeding precautions and when to seek ER. Pt agreeable and verbalized understanding.  Routing to Dr Edward Jolly for update

## 2020-01-03 NOTE — Telephone Encounter (Signed)
Pt aware of expectations and denies anything in the vagina. Pt has scheduled 4 week on 01/24/20 at 430 pm.  Encounter closed.

## 2020-01-04 DIAGNOSIS — F5104 Psychophysiologic insomnia: Secondary | ICD-10-CM | POA: Diagnosis not present

## 2020-01-04 DIAGNOSIS — Z8659 Personal history of other mental and behavioral disorders: Secondary | ICD-10-CM | POA: Diagnosis not present

## 2020-01-04 DIAGNOSIS — F419 Anxiety disorder, unspecified: Secondary | ICD-10-CM | POA: Diagnosis not present

## 2020-01-19 NOTE — Progress Notes (Deleted)
GYNECOLOGY  VISIT   HPI: 24 y.o.   Single  Caucasian  female   G0P0000 with No LMP recorded. (Menstrual status: IUD).   here for 4 week follow up  LOOP ELECTROSURGICAL EXCISION PROCEDURE (LEEP)/ECC (N/A Vagina ) COLPOSCOPY (N/A Vagina ).    GYNECOLOGIC HISTORY: No LMP recorded. (Menstrual status: IUD). Contraception: Mirena IUD 05/2017 Menopausal hormone therapy:  n/a Last mammogram:  n/a Last pap smear:  10-31-19 LGSIL:Pos HR HPV--colpo revealed HGSIL--LEEP scheduled 12-27-19--LEEP revealed CIN II with neg.margins, 11/2018 abnormal per pt        OB History    Gravida  0   Para  0   Term  0   Preterm  0   AB  0   Living  0     SAB  0   TAB  0   Ectopic  0   Multiple  0   Live Births  0              There are no problems to display for this patient.   Past Medical History:  Diagnosis Date  . ADHD   . Anxiety    hx hives with anxiety  . Complication of anesthesia    wants scopolamine patch  . Depression   . Dysmenorrhea   . Endometriosis   . GERD (gastroesophageal reflux disease)   . Headache    migraines  . High grade squamous intraepithelial cervical dysplasia 11/2019   Endocervical glandular extension  . History of abnormal cervical Pap smear   . History of suicidal tendencies 2019  . IBS (irritable bowel syndrome)   . IUD (intrauterine device) in place    IUD threads cut short for LEEP procedure done 12/27/19  . PONV (postoperative nausea and vomiting)     Past Surgical History:  Procedure Laterality Date  . COLPOSCOPY N/A 12/27/2019   Procedure: COLPOSCOPY;  Surgeon: Patton Salles, MD;  Location: [facility];  Service: Gynecology;  Laterality: N/A;  . fractured arm Right 2008   sx done  . LEEP N/A 12/27/2019   Procedure: LOOP ELECTROSURGICAL EXCISION PROCEDURE (LEEP)/ECC;  Surgeon: Patton Salles, MD;  Location: [facility];  Service: Gynecology;  Laterality: N/A;  . PELVIC LAPAROSCOPY   2016   dx'd with endometriosis per patient--[place], [place].  Op report indicates lysis of adhesions.   . TONSILLECTOMY  as child    Current Outpatient Medications  Medication Sig Dispense Refill  . dicyclomine (BENTYL) 10 MG capsule Take 10 mg by mouth as needed.     Marland Kitchen esomeprazole (NEXIUM) 20 MG packet Take 20 mg by mouth daily before breakfast.    . hydrOXYzine (ATARAX/VISTARIL) 25 MG tablet Take 25 mg by mouth as needed.    Marland Kitchen levonorgestrel (MIRENA) 20 MCG/24HR IUD 1 each by Intrauterine route once. Inserted 2019    . ondansetron (ZOFRAN) 4 MG tablet Take 4 mg by mouth 2 (two) times daily as needed.     . Vitamin D, Ergocalciferol, (DRISDOL) 1.25 MG (50000 UNIT) CAPS capsule Take 1 capsule (50,000 Units total) by mouth every 7 (seven) days. (Patient taking differently: Take 50,000 Units by mouth every 7 (seven) days. tuesday) 12 capsule 0   No current facility-administered medications for this visit.     ALLERGIES: Hydrocodone, Macrobid [nitrofurantoin], and Sulfa antibiotics  Family History  Problem Relation Age of Onset  . Diabetes Mother   . Cancer Maternal Grandmother 57  Dec from colon cancer  . Diabetes Maternal Grandfather   . Stroke Maternal Grandfather   . Stroke Maternal Aunt     Social History   Socioeconomic History  . Marital status: Single    Spouse name: Not on file  . Number of children: Not on file  . Years of education: Not on file  . Highest education level: Not on file  Occupational History  . Not on file  Tobacco Use  . Smoking status: Never Smoker  . Smokeless tobacco: Never Used  Vaping Use  . Vaping Use: Every day  . Last attempt to quit: 05/20/2019  . Substances: Nicotine  . Devices: quit nicotine jan 2021 vapes cannabus now  Substance and Sexual Activity  . Alcohol use: Not Currently  . Drug use: Yes    Frequency: 1.0 times per week    Types: Marijuana    Comment: uses for nausea last used 12-20-2019  . Sexual activity: Not Currently     Birth control/protection: I.U.D.    Comment: Mirena IUD 05/2017  Other Topics Concern  . Not on file  Social History Narrative  . Not on file   Social Determinants of Health   Financial Resource Strain:   . Difficulty of Paying Living Expenses: Not on file  Food Insecurity:   . Worried About Programme researcher, broadcasting/film/video in the Last Year: Not on file  . Ran Out of Food in the Last Year: Not on file  Transportation Needs:   . Lack of Transportation (Medical): Not on file  . Lack of Transportation (Non-Medical): Not on file  Physical Activity:   . Days of Exercise per Week: Not on file  . Minutes of Exercise per Session: Not on file  Stress:   . Feeling of Stress : Not on file  Social Connections:   . Frequency of Communication with Friends and Family: Not on file  . Frequency of Social Gatherings with Friends and Family: Not on file  . Attends Religious Services: Not on file  . Active Member of Clubs or Organizations: Not on file  . Attends Banker Meetings: Not on file  . Marital Status: Not on file  Intimate Partner Violence:   . Fear of Current or Ex-Partner: Not on file  . Emotionally Abused: Not on file  . Physically Abused: Not on file  . Sexually Abused: Not on file    Review of Systems  PHYSICAL EXAMINATION:    There were no vitals taken for this visit.    General appearance: alert, cooperative and appears stated age Head: Normocephalic, without obvious abnormality, atraumatic Neck: no adenopathy, supple, symmetrical, trachea midline and thyroid normal to inspection and palpation Lungs: clear to auscultation bilaterally Breasts: normal appearance, no masses or tenderness, No nipple retraction or dimpling, No nipple discharge or bleeding, No axillary or supraclavicular adenopathy Heart: regular rate and rhythm Abdomen: soft, non-tender, no masses,  no organomegaly Extremities: extremities normal, atraumatic, no cyanosis or edema Skin: Skin color, texture,  turgor normal. No rashes or lesions Lymph nodes: Cervical, supraclavicular, and axillary nodes normal. No abnormal inguinal nodes palpated Neurologic: Grossly normal  Pelvic: External genitalia:  no lesions              Urethra:  normal appearing urethra with no masses, tenderness or lesions              Bartholins and Skenes: normal  Vagina: normal appearing vagina with normal color and discharge, no lesions              Cervix: no lesions                Bimanual Exam:  Uterus:  normal size, contour, position, consistency, mobility, non-tender              Adnexa: no mass, fullness, tenderness              Rectal exam: {yes no:[id number]}.  Confirms.              Anus:  normal sphincter tone, no lesions  Chaperone was present for exam.  ASSESSMENT     PLAN     An After Visit Summary was printed and given to the patient.  ______ minutes face to face time of which over 50% was spent in counseling.

## 2020-01-24 ENCOUNTER — Ambulatory Visit: Payer: BC Managed Care – PPO | Admitting: Obstetrics and Gynecology

## 2020-01-24 NOTE — Progress Notes (Signed)
GYNECOLOGY  VISIT   HPI: 24 y.o.   Single  Caucasian  female   G0P0000 with No LMP recorded. (Menstrual status: IUD).   here for 4 weeks follow up LOOP ELECTROSURGICAL EXCISION PROCEDURE (LEEP)/ECC (N/A Vagina ) COLPOSCOPY (N/A Vagina ).  Pathology:  LEEP:  CIN 2 with negative margins, endocervical pass:  Benign, ECC: rare endocervical cells. IUD string were cut short at the time of her procedure.  Bled 2 weeks after her procedure.  No more bleeding.   Cramped for 2 -3 days post procedure.   Will see psychiatry for a new dx of bipolar.   States her scopalamine patch helped her nausea.     GYNECOLOGIC HISTORY: No LMP recorded. (Menstrual status: IUD). Contraception: Mirena IUD 05/2017 Menopausal hormone therapy:  n/a Last mammogram:  n/a Last pap smear: 10-31-19 LGSIL:Pos HR HPV--colpo revealed HGSIL--LEEP scheduled 12-27-19--LEEP revealed CIN II with neg.margins,11/2018 abnormal per pt                OB History    Gravida  0   Para  0   Term  0   Preterm  0   AB  0   Living  0     SAB  0   TAB  0   Ectopic  0   Multiple  0   Live Births  0              There are no problems to display for this patient.   Past Medical History:  Diagnosis Date  . ADHD   . Anxiety    hx hives with anxiety  . Complication of anesthesia    wants scopolamine patch  . Depression   . Dysmenorrhea   . Endometriosis   . GERD (gastroesophageal reflux disease)   . Headache    migraines  . High grade squamous intraepithelial cervical dysplasia 11/2019   Endocervical glandular extension  . History of abnormal cervical Pap smear   . History of suicidal tendencies 2019  . IBS (irritable bowel syndrome)   . IUD (intrauterine device) in place    IUD threads cut short for LEEP procedure done 12/27/19  . PONV (postoperative nausea and vomiting)     Past Surgical History:  Procedure Laterality Date  . COLPOSCOPY N/A 12/27/2019   Procedure: COLPOSCOPY;  Surgeon: Patton Salles, MD;  Location: [facility];  Service: Gynecology;  Laterality: N/A;  . fractured arm Right 2008   sx done  . LEEP N/A 12/27/2019   Procedure: LOOP ELECTROSURGICAL EXCISION PROCEDURE (LEEP)/ECC;  Surgeon: Patton Salles, MD;  Location: [facility];  Service: Gynecology;  Laterality: N/A;  . PELVIC LAPAROSCOPY  2016   dx'd with endometriosis per patient--[place], [place].  Op report indicates lysis of adhesions.   . TONSILLECTOMY  as child    Current Outpatient Medications  Medication Sig Dispense Refill  . dicyclomine (BENTYL) 10 MG capsule Take 10 mg by mouth as needed.     Marland Kitchen esomeprazole (NEXIUM) 20 MG packet Take 20 mg by mouth daily before breakfast.    . levonorgestrel (MIRENA) 20 MCG/24HR IUD 1 each by Intrauterine route once. Inserted 2019    . mirtazapine (REMERON) 15 MG tablet Take 15 mg by mouth at bedtime.    . ondansetron (ZOFRAN) 4 MG tablet Take 4 mg by mouth 2 (two) times daily as needed.     . Vitamin D, Ergocalciferol, (DRISDOL) 1.25 MG (50000 UNIT) CAPS  capsule Take 1 capsule (50,000 Units total) by mouth every 7 (seven) days. (Patient taking differently: Take 50,000 Units by mouth every 7 (seven) days. tuesday) 12 capsule 0  . hydrOXYzine (ATARAX/VISTARIL) 25 MG tablet Take 25 mg by mouth as needed. (Patient not taking: Reported on 01/25/2020)     No current facility-administered medications for this visit.     ALLERGIES: Hydrocodone, Macrobid [nitrofurantoin], and Sulfa antibiotics  Family History  Problem Relation Age of Onset  . Diabetes Mother   . Cancer Maternal Grandmother 75       Dec from colon cancer  . Diabetes Maternal Grandfather   . Stroke Maternal Grandfather   . Stroke Maternal Aunt     Social History   Socioeconomic History  . Marital status: Single    Spouse name: Not on file  . Number of children: Not on file  . Years of education: Not on file  . Highest education level: Not on file   Occupational History  . Not on file  Tobacco Use  . Smoking status: Never Smoker  . Smokeless tobacco: Never Used  Vaping Use  . Vaping Use: Every day  . Last attempt to quit: 05/20/2019  . Substances: Nicotine  . Devices: quit nicotine jan 2021 vapes cannabus now  Substance and Sexual Activity  . Alcohol use: Not Currently  . Drug use: Yes    Frequency: 1.0 times per week    Types: Marijuana    Comment: uses for nausea last used 12-20-2019  . Sexual activity: Not Currently    Birth control/protection: I.U.D.    Comment: Mirena IUD 05/2017  Other Topics Concern  . Not on file  Social History Narrative  . Not on file   Social Determinants of Health   Financial Resource Strain:   . Difficulty of Paying Living Expenses: Not on file  Food Insecurity:   . Worried About Programme researcher, broadcasting/film/video in the Last Year: Not on file  . Ran Out of Food in the Last Year: Not on file  Transportation Needs:   . Lack of Transportation (Medical): Not on file  . Lack of Transportation (Non-Medical): Not on file  Physical Activity:   . Days of Exercise per Week: Not on file  . Minutes of Exercise per Session: Not on file  Stress:   . Feeling of Stress : Not on file  Social Connections:   . Frequency of Communication with Friends and Family: Not on file  . Frequency of Social Gatherings with Friends and Family: Not on file  . Attends Religious Services: Not on file  . Active Member of Clubs or Organizations: Not on file  . Attends Banker Meetings: Not on file  . Marital Status: Not on file  Intimate Partner Violence:   . Fear of Current or Ex-Partner: Not on file  . Emotionally Abused: Not on file  . Physically Abused: Not on file  . Sexually Abused: Not on file    Review of Systems  All other systems reviewed and are negative.   PHYSICAL EXAMINATION:    BP 100/64   Pulse 80   Ht 5' 6.5" (1.689 m)   Wt 146 lb 6.4 oz (66.4 kg)   BMI 23.28 kg/m     General appearance:  alert, cooperative and appears stated age    Pelvic: External genitalia:  no lesions              Urethra:  normal appearing urethra with no masses, tenderness  or lesions              Bartholins and Skenes: normal                 Vagina: normal appearing vagina with normal color and discharge, no lesions              Cervix: no lesions.  Ectropion noted.   IUD strings not seen or felt.                 Bimanual Exam:  Uterus:  normal size, contour, position, consistency, mobility, non-tender              Adnexa: no mass, fullness, tenderness               Chaperone was present for exam.  ASSESSMENT  Status post LEEP for CIN II with endocervical glandular extension.  Margins negative.  Mirena IUD with short strings.   PLAN  Pathology report reviewed.  Fu for next cervical cancer screening in April 2022.

## 2020-01-25 ENCOUNTER — Encounter: Payer: Self-pay | Admitting: Obstetrics and Gynecology

## 2020-01-25 ENCOUNTER — Ambulatory Visit (INDEPENDENT_AMBULATORY_CARE_PROVIDER_SITE_OTHER): Payer: BC Managed Care – PPO | Admitting: Obstetrics and Gynecology

## 2020-01-25 ENCOUNTER — Other Ambulatory Visit: Payer: Self-pay

## 2020-01-25 VITALS — BP 100/64 | HR 80 | Ht 66.5 in | Wt 146.4 lb

## 2020-01-25 DIAGNOSIS — Z9889 Other specified postprocedural states: Secondary | ICD-10-CM

## 2020-02-01 DIAGNOSIS — R109 Unspecified abdominal pain: Secondary | ICD-10-CM | POA: Diagnosis not present

## 2020-02-01 DIAGNOSIS — R198 Other specified symptoms and signs involving the digestive system and abdomen: Secondary | ICD-10-CM | POA: Diagnosis not present

## 2020-02-01 DIAGNOSIS — R112 Nausea with vomiting, unspecified: Secondary | ICD-10-CM | POA: Diagnosis not present

## 2020-02-01 DIAGNOSIS — R140 Abdominal distension (gaseous): Secondary | ICD-10-CM | POA: Diagnosis not present

## 2020-02-02 ENCOUNTER — Other Ambulatory Visit (HOSPITAL_COMMUNITY): Payer: Self-pay | Admitting: Gastroenterology

## 2020-02-02 ENCOUNTER — Other Ambulatory Visit: Payer: Self-pay | Admitting: Gastroenterology

## 2020-02-02 DIAGNOSIS — F419 Anxiety disorder, unspecified: Secondary | ICD-10-CM | POA: Diagnosis not present

## 2020-02-02 DIAGNOSIS — R112 Nausea with vomiting, unspecified: Secondary | ICD-10-CM

## 2020-02-02 DIAGNOSIS — F5104 Psychophysiologic insomnia: Secondary | ICD-10-CM | POA: Diagnosis not present

## 2020-02-02 DIAGNOSIS — Z8659 Personal history of other mental and behavioral disorders: Secondary | ICD-10-CM | POA: Diagnosis not present

## 2020-02-23 ENCOUNTER — Encounter (HOSPITAL_COMMUNITY)
Admission: RE | Admit: 2020-02-23 | Discharge: 2020-02-23 | Disposition: A | Discharge status: Home / Self Care | Payer: BC Managed Care – PPO | Source: Ambulatory Visit | Attending: Gastroenterology | Admitting: Gastroenterology

## 2020-02-23 ENCOUNTER — Other Ambulatory Visit: Payer: Self-pay

## 2020-02-23 DIAGNOSIS — R110 Nausea: Secondary | ICD-10-CM | POA: Diagnosis not present

## 2020-02-23 DIAGNOSIS — R112 Nausea with vomiting, unspecified: Secondary | ICD-10-CM | POA: Insufficient documentation

## 2020-02-23 MED ORDER — TECHNETIUM TC 99M SULFUR COLLOID
2.0000 | Freq: Once | INTRAVENOUS | Status: AC | PRN
  Administered 2020-02-23: 2 via INTRAVENOUS

## 2020-03-01 ENCOUNTER — Other Ambulatory Visit: Payer: Self-pay | Admitting: Obstetrics and Gynecology

## 2020-03-01 DIAGNOSIS — R7989 Other specified abnormal findings of blood chemistry: Secondary | ICD-10-CM

## 2020-03-01 NOTE — Telephone Encounter (Signed)
Medication refill request: Vitamin D 50000iu Last AEX:  10/31/19 Next AEX: 08/21/20 Last MMG (if hormonal medication request): NA Refill authorized: 12/0

## 2020-03-13 ENCOUNTER — Other Ambulatory Visit: Payer: Self-pay | Admitting: Obstetrics and Gynecology

## 2020-03-13 DIAGNOSIS — R7989 Other specified abnormal findings of blood chemistry: Secondary | ICD-10-CM

## 2020-03-13 NOTE — Telephone Encounter (Signed)
Refill for vit D, #2, was correct.  This was for her to have enough until she had her level rechecked with lab visit.  Will reassess after lab done on 03/15/20. Thanks.

## 2020-03-13 NOTE — Telephone Encounter (Signed)
Medication refill request: Vit D  Last AEX:  10/31/19 Next AEX: 08/21/20 Last MMG (if hormonal medication request): NA Refill authorized: 12/0   At last refill only 2 were sent in instead of 12 - correction to 3 month supply

## 2020-03-15 ENCOUNTER — Other Ambulatory Visit: Payer: BC Managed Care – PPO

## 2020-03-15 ENCOUNTER — Other Ambulatory Visit: Payer: Self-pay

## 2020-03-15 DIAGNOSIS — R7989 Other specified abnormal findings of blood chemistry: Secondary | ICD-10-CM | POA: Diagnosis not present

## 2020-03-16 LAB — VITAMIN D 25 HYDROXY (VIT D DEFICIENCY, FRACTURES): Vit D, 25-Hydroxy: 33.5 ng/mL (ref 30.0–100.0)

## 2020-03-19 ENCOUNTER — Telehealth: Payer: Self-pay

## 2020-03-19 NOTE — Telephone Encounter (Signed)
Attempted to return call to pt. No answer and no voicemail set up or Mychart. Will wait to call back to pt.   Last AEX 10/2019

## 2020-03-19 NOTE — Telephone Encounter (Signed)
-----   Message from Debbie Salles, MD sent at 03/19/2020  7:31 AM EDT ----- Please contact patient in follow up to her blood work.  Her vitamin D level is now normal.  I recommend she take over the counter vitamin D 1000 IU daily.   I am highlighting this result so you know to contact the patient.

## 2020-03-19 NOTE — Telephone Encounter (Signed)
Tried to call patient, unable to leave a message.  

## 2020-03-19 NOTE — Telephone Encounter (Signed)
Patient would like to discuss surgery. 

## 2020-03-21 NOTE — Telephone Encounter (Signed)
Left message for pt to return call to triage RN. 

## 2020-03-22 NOTE — Telephone Encounter (Signed)
AEX 10/2019 H/o HGSIL on colpo 7/21 H/o CIN II and neg margins on LEEP 8/21 H/o lap 2016 d/t endometriosis   Spoke with pt. Pt states having reoccurring right and left side pain in lower quadrant as before x 1 year now. States having  increased sx  X 1-2 months. Pt states pain is inconsistent with either dull or sharp pains intermittently. States sometimes hurts when walking, lying down. Pt does have nausea hx and states is switching to new GI provider Dr Elnoria Howard soon. Pt is using Bentyl Rx and only helps for 1-2 hrs. Has used heating pad which helps, but does not resolve. Pt rates as 8 on pain scale when has sharp pains. Pt worried about having more scar tissue that was found with last laparoscopy in 2016 and wants to know if needs another procedure.   Pt advised to have OV for discussion and surgery consult. Pt agreeable. Pt scheduled for 11/11 at 11 am with Dr Edward Jolly. Pt agreeable to date and time of appt.  Routing to Dr Edward Jolly for review  Encounter closed

## 2020-03-26 NOTE — Telephone Encounter (Signed)
Tried to call patient, but unable to leave message on phone. It rings and then disconnects.

## 2020-03-27 NOTE — Progress Notes (Deleted)
GYNECOLOGY  VISIT   HPI: 24 y.o.   Single  {Race/ethnicity:17218}  female   G0P0000 with No LMP recorded. (Menstrual status: IUD).   here for surgery consult    GYNECOLOGIC HISTORY: No LMP recorded. (Menstrual status: IUD). Contraception:  Mirena IUD inserted 05/2017 Menopausal hormone therapy:  n/a Last mammogram:  n/a Last pap smear:   10/31/19 LGSIL:Pos HR HPV--colpo revealed HGSIL--12/27/19 LEEP revealed CIN II with neg.margins, 11/2018 abnormal per patient        OB History    Gravida  0   Para  0   Term  0   Preterm  0   AB  0   Living  0     SAB  0   TAB  0   Ectopic  0   Multiple  0   Live Births  0              There are no problems to display for this patient.   Past Medical History:  Diagnosis Date  . ADHD   . Anxiety    hx hives with anxiety  . Complication of anesthesia    wants scopolamine patch  . Depression   . Dysmenorrhea   . Endometriosis   . GERD (gastroesophageal reflux disease)   . Headache    migraines  . High grade squamous intraepithelial cervical dysplasia 11/2019   Endocervical glandular extension  . History of abnormal cervical Pap smear   . History of suicidal tendencies 2019  . IBS (irritable bowel syndrome)   . IUD (intrauterine device) in place    IUD threads cut short for LEEP procedure done 12/27/19  . PONV (postoperative nausea and vomiting)     Past Surgical History:  Procedure Laterality Date  . COLPOSCOPY N/A 12/27/2019   Procedure: COLPOSCOPY;  Surgeon: Patton Salles, MD;  Location: [facility];  Service: Gynecology;  Laterality: N/A;  . fractured arm Right 2008   sx done  . LEEP N/A 12/27/2019   Procedure: LOOP ELECTROSURGICAL EXCISION PROCEDURE (LEEP)/ECC;  Surgeon: Patton Salles, MD;  Location: [facility];  Service: Gynecology;  Laterality: N/A;  . PELVIC LAPAROSCOPY  2016   dx'd with endometriosis per patient--[place], [place].  Op report indicates lysis  of adhesions.   . TONSILLECTOMY  as child    Current Outpatient Medications  Medication Sig Dispense Refill  . dicyclomine (BENTYL) 10 MG capsule Take 10 mg by mouth as needed.     Marland Kitchen esomeprazole (NEXIUM) 20 MG packet Take 20 mg by mouth daily before breakfast.    . hydrOXYzine (ATARAX/VISTARIL) 25 MG tablet Take 25 mg by mouth as needed. (Patient not taking: Reported on 01/25/2020)    . levonorgestrel (MIRENA) 20 MCG/24HR IUD 1 each by Intrauterine route once. Inserted 2019    . mirtazapine (REMERON) 15 MG tablet Take 15 mg by mouth at bedtime.    . ondansetron (ZOFRAN) 4 MG tablet Take 4 mg by mouth 2 (two) times daily as needed.     . Vitamin D, Ergocalciferol, (DRISDOL) 1.25 MG (50000 UNIT) CAPS capsule TAKE 1 CAPSULE BY MOUTH EVERY 7 DAYS 2 capsule 0   No current facility-administered medications for this visit.     ALLERGIES: Hydrocodone, Macrobid [nitrofurantoin], and Sulfa antibiotics  Family History  Problem Relation Age of Onset  . Diabetes Mother   . Cancer Maternal Grandmother 75       Dec from colon cancer  . Diabetes Maternal  Grandfather   . Stroke Maternal Grandfather   . Stroke Maternal Aunt     Social History   Socioeconomic History  . Marital status: Single    Spouse name: Not on file  . Number of children: Not on file  . Years of education: Not on file  . Highest education level: Not on file  Occupational History  . Not on file  Tobacco Use  . Smoking status: Never Smoker  . Smokeless tobacco: Never Used  Vaping Use  . Vaping Use: Every day  . Last attempt to quit: 05/20/2019  . Substances: Nicotine  . Devices: quit nicotine jan 2021 vapes cannabus now  Substance and Sexual Activity  . Alcohol use: Not Currently  . Drug use: Yes    Frequency: 1.0 times per week    Types: Marijuana    Comment: uses for nausea last used 12-20-2019  . Sexual activity: Not Currently    Birth control/protection: I.U.D.    Comment: Mirena IUD 05/2017  Other Topics Concern   . Not on file  Social History Narrative  . Not on file   Social Determinants of Health   Financial Resource Strain:   . Difficulty of Paying Living Expenses: Not on file  Food Insecurity:   . Worried About Programme researcher, broadcasting/film/video in the Last Year: Not on file  . Ran Out of Food in the Last Year: Not on file  Transportation Needs:   . Lack of Transportation (Medical): Not on file  . Lack of Transportation (Non-Medical): Not on file  Physical Activity:   . Days of Exercise per Week: Not on file  . Minutes of Exercise per Session: Not on file  Stress:   . Feeling of Stress : Not on file  Social Connections:   . Frequency of Communication with Friends and Family: Not on file  . Frequency of Social Gatherings with Friends and Family: Not on file  . Attends Religious Services: Not on file  . Active Member of Clubs or Organizations: Not on file  . Attends Banker Meetings: Not on file  . Marital Status: Not on file  Intimate Partner Violence:   . Fear of Current or Ex-Partner: Not on file  . Emotionally Abused: Not on file  . Physically Abused: Not on file  . Sexually Abused: Not on file    Review of Systems  PHYSICAL EXAMINATION:    There were no vitals taken for this visit.    General appearance: alert, cooperative and appears stated age Head: Normocephalic, without obvious abnormality, atraumatic Neck: no adenopathy, supple, symmetrical, trachea midline and thyroid normal to inspection and palpation Lungs: clear to auscultation bilaterally Breasts: normal appearance, no masses or tenderness, No nipple retraction or dimpling, No nipple discharge or bleeding, No axillary or supraclavicular adenopathy Heart: regular rate and rhythm Abdomen: soft, non-tender, no masses,  no organomegaly Extremities: extremities normal, atraumatic, no cyanosis or edema Skin: Skin color, texture, turgor normal. No rashes or lesions Lymph nodes: Cervical, supraclavicular, and axillary  nodes normal. No abnormal inguinal nodes palpated Neurologic: Grossly normal  Pelvic: External genitalia:  no lesions              Urethra:  normal appearing urethra with no masses, tenderness or lesions              Bartholins and Skenes: normal                 Vagina: normal appearing vagina with normal color  and discharge, no lesions              Cervix: no lesions                Bimanual Exam:  Uterus:  normal size, contour, position, consistency, mobility, non-tender              Adnexa: no mass, fullness, tenderness              Rectal exam: {yes no:314532}.  Confirms.              Anus:  normal sphincter tone, no lesions  Chaperone was present for exam.  ASSESSMENT     PLAN     An After Visit Summary was printed and given to the patient.  ______ minutes face to face time of which over 50% was spent in counseling.

## 2020-03-29 ENCOUNTER — Ambulatory Visit: Payer: Self-pay | Admitting: Obstetrics and Gynecology

## 2020-03-29 ENCOUNTER — Telehealth: Payer: Self-pay

## 2020-03-29 NOTE — Telephone Encounter (Signed)
Spoke with patient and notified of lab results--advised should receive letter today.

## 2020-03-29 NOTE — Telephone Encounter (Signed)
Patient called to cancel surgery consult for today due to not feeling well. Patient would like to reschedule this appointment.

## 2020-03-29 NOTE — Telephone Encounter (Signed)
Spoke with patient. She states cancelled appointment today for surgical consult due to sickness. Denies any COVID symptoms. Rescheduled appointment for 04-18-20 11:00am.

## 2020-03-29 NOTE — Telephone Encounter (Signed)
Thank you for the update!

## 2020-04-02 DIAGNOSIS — F331 Major depressive disorder, recurrent, moderate: Secondary | ICD-10-CM | POA: Diagnosis not present

## 2020-04-10 DIAGNOSIS — F411 Generalized anxiety disorder: Secondary | ICD-10-CM | POA: Diagnosis not present

## 2020-04-10 DIAGNOSIS — F900 Attention-deficit hyperactivity disorder, predominantly inattentive type: Secondary | ICD-10-CM | POA: Diagnosis not present

## 2020-04-10 DIAGNOSIS — F3181 Bipolar II disorder: Secondary | ICD-10-CM | POA: Diagnosis not present

## 2020-04-17 DIAGNOSIS — F3181 Bipolar II disorder: Secondary | ICD-10-CM | POA: Diagnosis not present

## 2020-04-17 DIAGNOSIS — F411 Generalized anxiety disorder: Secondary | ICD-10-CM | POA: Diagnosis not present

## 2020-04-17 DIAGNOSIS — F900 Attention-deficit hyperactivity disorder, predominantly inattentive type: Secondary | ICD-10-CM | POA: Diagnosis not present

## 2020-04-18 ENCOUNTER — Other Ambulatory Visit: Payer: Self-pay

## 2020-04-18 ENCOUNTER — Encounter: Payer: Self-pay | Admitting: Obstetrics and Gynecology

## 2020-04-18 ENCOUNTER — Ambulatory Visit: Payer: BC Managed Care – PPO | Admitting: Obstetrics and Gynecology

## 2020-04-18 VITALS — BP 114/68 | HR 60 | Resp 12 | Ht 66.5 in | Wt 143.0 lb

## 2020-04-18 DIAGNOSIS — R198 Other specified symptoms and signs involving the digestive system and abdomen: Secondary | ICD-10-CM | POA: Diagnosis not present

## 2020-04-18 DIAGNOSIS — R102 Pelvic and perineal pain: Secondary | ICD-10-CM

## 2020-04-18 LAB — POCT URINALYSIS DIPSTICK
Bilirubin, UA: NEGATIVE
Blood, UA: NEGATIVE
Glucose, UA: NEGATIVE
Ketones, UA: NEGATIVE
Leukocytes, UA: NEGATIVE
Nitrite, UA: NEGATIVE
Protein, UA: NEGATIVE
Spec Grav, UA: 1.02 (ref 1.010–1.025)
Urobilinogen, UA: 1 E.U./dL
pH, UA: 5 (ref 5.0–8.0)

## 2020-04-18 NOTE — Progress Notes (Signed)
GYNECOLOGY  VISIT   HPI:  24 y.o.   Single  Caucasian  female   G0P0000 with No LMP recorded. (Menstrual status: IUD).   here for  Surgery consult. She states that she has some white discharge and asked to leave a urine. She denies any UTI symptoms but said that she wanted to just have it checked.  No partner change.   Patient has chronic RLQ pain, which has spread to her left side.  She has painful BMs.  She does have loose stool, but not diarrhea.  She is feels like something in pulling on her right and left side.  She cannot sleep on her back due to the pain.   She smokes marijuana to relieve the pain.  Aleve helps the pain, and Bentyl does not necessarily help.  She does not have menstruation with her Mirena IUD. The pain has increased intensity at the end of the month.  She has nausea which can last for a few days.  She takes Zofran for nausea.  She had an emptying test with her GI in October, Dr. Guillermina City at [facility]. She would like to transfer her care to Dr. Elnoria Howard.   Hx laparoscopy with LOA in 2016.   Pelvic US 11/24/19: Uterus no masses.  EMS 2.31 mm. IUD in endometrial canal.  Ovaries normal.  No free fluid.  Patient has nausea with general anesthesia.  Scopolamine patch helps this.  She has a psychiatrist and therapist and is working on her mental health.   Urine dip:  Negative.   GYNECOLOGIC HISTORY: No LMP recorded. (Menstrual status: IUD). Contraception:  Mirena IUD Menopausal hormone therapy:  n/a Last mammogram:  n/a Last pap smear:   12/27/19 LEEP showed CIN II with Neg margins; 10/31/19 LGSIL:Pos HR HPV; colpo showed HGSIL        OB History    Gravida  0   Para  0   Term  0   Preterm  0   AB  0   Living  0     SAB  0   TAB  0   Ectopic  0   Multiple  0   Live Births  0              There are no problems to display for this patient.   Past Medical History:  Diagnosis Date  . ADHD   . Anxiety    hx hives with anxiety  .  Complication of anesthesia    wants scopolamine patch  . Depression   . Dysmenorrhea   . Endometriosis   . GERD (gastroesophageal reflux disease)   . Headache    migraines  . High grade squamous intraepithelial cervical dysplasia 11/2019   Endocervical glandular extension  . History of abnormal cervical Pap smear   . History of suicidal tendencies 2019  . IBS (irritable bowel syndrome)   . IUD (intrauterine device) in place    IUD threads cut short for LEEP procedure done 12/27/19  . PONV (postoperative nausea and vomiting)     Past Surgical History:  Procedure Laterality Date  . COLPOSCOPY N/A 12/27/2019   Procedure: COLPOSCOPY;  Surgeon: Patton Salles, MD;  Location: [facility];  Service: Gynecology;  Laterality: N/A;  . fractured arm Right 2008   sx done  . LEEP N/A 12/27/2019   Procedure: LOOP ELECTROSURGICAL EXCISION PROCEDURE (LEEP)/ECC;  Surgeon: Patton Salles, MD;  Location: [facility];  Service:  Gynecology;  Laterality: N/A;  . PELVIC LAPAROSCOPY  2016   dx'd with endometriosis per patient--[place], [place].  Op report indicates lysis of adhesions.   . TONSILLECTOMY  as child    Current Outpatient Medications  Medication Sig Dispense Refill  . dicyclomine (BENTYL) 10 MG capsule Take 10 mg by mouth as needed.     Marland Kitchen esomeprazole (NEXIUM) 20 MG packet Take 20 mg by mouth daily before breakfast.    . hydrOXYzine (ATARAX/VISTARIL) 25 MG tablet Take 25 mg by mouth as needed. (Patient not taking: Reported on 01/25/2020)    . levonorgestrel (MIRENA) 20 MCG/24HR IUD 1 each by Intrauterine route once. Inserted 2019    . mirtazapine (REMERON) 15 MG tablet Take 15 mg by mouth at bedtime.    . ondansetron (ZOFRAN) 4 MG tablet Take 4 mg by mouth 2 (two) times daily as needed.     . Vitamin D, Ergocalciferol, (DRISDOL) 1.25 MG (50000 UNIT) CAPS capsule TAKE 1 CAPSULE BY MOUTH EVERY 7 DAYS 2 capsule 0   No current facility-administered  medications for this visit.     ALLERGIES: Hydrocodone, Macrobid [nitrofurantoin], and Sulfa antibiotics  Family History  Problem Relation Age of Onset  . Diabetes Mother   . Cancer Maternal Grandmother 75       Dec from colon cancer  . Diabetes Maternal Grandfather   . Stroke Maternal Grandfather   . Stroke Maternal Aunt     Social History   Socioeconomic History  . Marital status: Single    Spouse name: Not on file  . Number of children: Not on file  . Years of education: Not on file  . Highest education level: Not on file  Occupational History  . Not on file  Tobacco Use  . Smoking status: Never Smoker  . Smokeless tobacco: Never Used  Vaping Use  . Vaping Use: Every day  . Last attempt to quit: 05/20/2019  . Substances: Nicotine  . Devices: quit nicotine jan 2021 vapes cannabus now  Substance and Sexual Activity  . Alcohol use: Not Currently  . Drug use: Yes    Frequency: 1.0 times per week    Types: Marijuana    Comment: uses for nausea last used 12-20-2019  . Sexual activity: Not Currently    Birth control/protection: I.U.D.    Comment: Mirena IUD 05/2017  Other Topics Concern  . Not on file  Social History Narrative  . Not on file   Social Determinants of Health   Financial Resource Strain:   . Difficulty of Paying Living Expenses: Not on file  Food Insecurity:   . Worried About Programme researcher, broadcasting/film/video in the Last Year: Not on file  . Ran Out of Food in the Last Year: Not on file  Transportation Needs:   . Lack of Transportation (Medical): Not on file  . Lack of Transportation (Non-Medical): Not on file  Physical Activity:   . Days of Exercise per Week: Not on file  . Minutes of Exercise per Session: Not on file  Stress:   . Feeling of Stress : Not on file  Social Connections:   . Frequency of Communication with Friends and Family: Not on file  . Frequency of Social Gatherings with Friends and Family: Not on file  . Attends Religious Services: Not on  file  . Active Member of Clubs or Organizations: Not on file  . Attends Banker Meetings: Not on file  . Marital Status: Not on file  Intimate  Partner Violence:   . Fear of Current or Ex-Partner: Not on file  . Emotionally Abused: Not on file  . Physically Abused: Not on file  . Sexually Abused: Not on file    Review of Systems  All other systems reviewed and are negative.   PHYSICAL EXAMINATION:    BP 114/68   Pulse 60   Resp 12   Ht 5' 6.5" (1.689 m)   Wt 143 lb (64.9 kg)   SpO2 99%   BMI 22.74 kg/m     General appearance: alert, cooperative and appears stated age Head: Normocephalic, without obvious abnormality, atraumatic Lungs: clear to auscultation bilaterally Heart: regular rate and rhythm Abdomen: soft, non-tender, no masses,  no organomegaly No abnormal inguinal nodes palpated Neurologic: Grossly normal  Pelvic: External genitalia:  no lesions              Urethra:  normal appearing urethra with no masses, tenderness or lesions              Bartholins and Skenes: normal                 Vagina: normal appearing vagina with normal color and discharge, no lesions              Cervix: no lesions.  4 mm polyp.   IUD strings not seen.                 Bimanual Exam:  Uterus:  normal size, contour, position, consistency, mobility, non-tender              Adnexa: no mass, fullness, tenderness          Chaperone was present for exam.  ASSESSMENT  Pelvic pain.  Chronic RLQ pain.  New onset LLQ pain.  Hx adhesive disease.  Mirena IUD.  Strings cut short for LEEP. Cervical polyp versus granulation tissue.  Painful BMs.   PLAN  We reviewed benefits and risks of laparoscopy.  Risks of laparoscopy include but are not limited to bleeding, infection, damage to surrounding organs, reaction to anesthesia, pneumonia, DVT, PE, death, hernia formation, possible continued pain, and a negative laparoscopy.  Will plan for removal of cervical polyp or granulation  tissue at time of laparoscopy.   Referral to Dr. Elnoria Howard.  She declines completing her GI evaluation prior to doing a laparoscopy.   30 min total time was spent for this patient encounter, including preparation, face-to-face counseling with the patient, coordination of care, and documentation of the encounter.

## 2020-04-19 DIAGNOSIS — F410 Panic disorder [episodic paroxysmal anxiety] without agoraphobia: Secondary | ICD-10-CM | POA: Diagnosis not present

## 2020-04-19 DIAGNOSIS — F3181 Bipolar II disorder: Secondary | ICD-10-CM | POA: Diagnosis not present

## 2020-04-19 DIAGNOSIS — F900 Attention-deficit hyperactivity disorder, predominantly inattentive type: Secondary | ICD-10-CM | POA: Diagnosis not present

## 2020-04-21 ENCOUNTER — Telehealth: Payer: Self-pay | Admitting: Obstetrics and Gynecology

## 2020-04-21 NOTE — Telephone Encounter (Signed)
Please precert and schedule surgery for my patient.   She needs a laparoscopy with possible treatment of endometriosis and adhesive disease and removal of vaginal granulation tissue.   Her diagnosis is chronic pelvic pain.   She currently has a Mirena IUD.

## 2020-04-24 DIAGNOSIS — F900 Attention-deficit hyperactivity disorder, predominantly inattentive type: Secondary | ICD-10-CM | POA: Diagnosis not present

## 2020-04-24 DIAGNOSIS — F411 Generalized anxiety disorder: Secondary | ICD-10-CM | POA: Diagnosis not present

## 2020-04-24 DIAGNOSIS — F3181 Bipolar II disorder: Secondary | ICD-10-CM | POA: Diagnosis not present

## 2020-04-24 NOTE — Telephone Encounter (Signed)
Please have patient eat some food, take her Aleve, and use a heating pad for her pain.

## 2020-04-24 NOTE — Telephone Encounter (Signed)
Patient says she is in a lot of pain °

## 2020-04-24 NOTE — Telephone Encounter (Signed)
AEX 10/2019 H/o LGSIL, HGSIL LEEP H/o pelvic pain Mirena IUD   Spoke with pt. Pt states having same pelvic pain since yesterday (12/6) with brown, dry, "tissue" discharge that was "stringy" Denies heavy vaginal bleeding, clots, odor, itching, fever, chills.  Pt states has not taken OTC Aleve for pain because has not eaten today. States was not able to get out of bed due to cramps today. Rates on pain scale 5-10. Felt nausea last night due to pain. No nausea, vomiting, diarrhea today. Advised pt to eat something to be able to take OTC Aleve to see if sx resolve or get better. Pt agreeable. Advised pt will review with Dr Edward Jolly and return call with recommendations. Pt agreeable.   Pt aware of needing to schedule Lap procedure per Dr Edward Jolly. Pt to be called back with benefits per Hayley. Pt verbalized understanding.   Routing to Dr Edward Jolly, Please advise  Cc: Hayley for benefits/ pre-surgery

## 2020-04-25 NOTE — Telephone Encounter (Signed)
Spoke with pt. Pt given update per Dr Edward Jolly. Pt states she smoked marijuana and ate some garlic bread, pasta and pain was relieved. Did not take Aleve. Advised pt to continue to eat healthy meals and take Aleve OTC for pain relief until surgery. Pt advised she will be contacted by [facility] for benefits and surgery planning. Pt agreeable and verbalized understanding.  Routing to Dr Edward Jolly for update Cc: Mayme Genta

## 2020-04-25 NOTE — Telephone Encounter (Signed)
Thank you for the update!

## 2020-04-26 NOTE — Telephone Encounter (Signed)
Spoke with patient regarding surgery benefits. Patient acknowledges understanding of information presented. Patient is aware that benefits presented are for professional benefits only. Patient is aware that once surgery is scheduled, the hospital will call with separate benefits. Patient is aware of surgery cancellation policy.  Patient would like to proceed with scheduling surgery for 05/15/2020. Patient is aware that she will have to quarantine the weekend before. Patient is agreeable.   Routing to [employer], Charity fundraiser, to proceed with scheduling.

## 2020-04-26 NOTE — Telephone Encounter (Signed)
Call to patient. Unable to leave voicemail requesting a return call to [facility] to review benefits and schedule recommended surgery with Brook A. Edward Jolly, MD, Evern Core

## 2020-04-30 NOTE — Telephone Encounter (Signed)
Call placed to home and mobile number, no answer on mobile number. Left message on home  to call Noreene Larsson, RN at [facility] [phone] to confirm surgery date and review pre-op instructions.

## 2020-04-30 NOTE — Telephone Encounter (Signed)
Spoke with patient and her mother. Surgery date request confirmed.  Advised surgery is scheduled for 05/15/20 at 1030 at [facility]. Surgery instruction sheet and hospital brochure reviewed, printed copy will be picked up in office by patient this week. Surgery folder placed at front desk for pick up.   Patient advised if Covid screening and quarantine requirements and agreeable.   Routing to provider. Encounter closed.

## 2020-05-02 ENCOUNTER — Other Ambulatory Visit: Payer: Self-pay

## 2020-05-02 DIAGNOSIS — R198 Other specified symptoms and signs involving the digestive system and abdomen: Secondary | ICD-10-CM

## 2020-05-02 DIAGNOSIS — R102 Pelvic and perineal pain: Secondary | ICD-10-CM

## 2020-05-02 NOTE — Progress Notes (Signed)
Debbie Warner, Debbie Genta  [facility] Triage Pool Cc: Patton Salles, MD I spoke with the patient and she would like for the referral to be sent to Dr. Carmelia Roller at [facility]. Please place a new referral.   Thank you,  Hayley    Referral placed to Dr Orvan Falconer Encounter closed

## 2020-05-10 ENCOUNTER — Encounter (HOSPITAL_BASED_OUTPATIENT_CLINIC_OR_DEPARTMENT_OTHER): Payer: Self-pay | Admitting: Obstetrics and Gynecology

## 2020-05-10 ENCOUNTER — Other Ambulatory Visit (HOSPITAL_COMMUNITY): Payer: BC Managed Care – PPO

## 2020-05-10 ENCOUNTER — Other Ambulatory Visit: Payer: Self-pay

## 2020-05-10 NOTE — Progress Notes (Addendum)
ADDENDUM:  Called and spoke w/ pt via phone to inform her to arrive at 0530 due to her procedure start was changed to 0730 and she may clear liquids from midnight tonight until 0430 then nothing else by mouth.  Pt verbalized understanding of updated instructions.   Spoke w/ via phone for pre-op interview--- PT Lab needs dos---- Urine preg (per anes.) / pre-op orders pending             Lab results------ no COVID test ------ 05-14-2020 @ 1425 Arrive at ------- 0845 NPO after MN NO Solid Food.  Clear liquids from MN until--- 0745 Medications to take morning of surgery ----- Prevacid Diabetic medication ----- n/a Patient Special Instructions ----- n/a Pre-Op special Istructions ----- n/a Patient verbalized understanding of instructions that were given at this phone interview. Patient denies shortness of breath, chest pain, fever, cough at this phone interview.

## 2020-05-14 ENCOUNTER — Other Ambulatory Visit (HOSPITAL_COMMUNITY)
Admission: RE | Admit: 2020-05-14 | Discharge: 2020-05-14 | Disposition: A | Discharge status: Home / Self Care | Payer: BC Managed Care – PPO | Source: Ambulatory Visit | Attending: Obstetrics and Gynecology | Admitting: Obstetrics and Gynecology

## 2020-05-14 ENCOUNTER — Telehealth: Payer: Self-pay | Admitting: *Deleted

## 2020-05-14 DIAGNOSIS — Z833 Family history of diabetes mellitus: Secondary | ICD-10-CM | POA: Diagnosis not present

## 2020-05-14 DIAGNOSIS — Z01812 Encounter for preprocedural laboratory examination: Secondary | ICD-10-CM | POA: Insufficient documentation

## 2020-05-14 DIAGNOSIS — Z882 Allergy status to sulfonamides status: Secondary | ICD-10-CM | POA: Diagnosis not present

## 2020-05-14 DIAGNOSIS — Z800 Family history of malignant neoplasm of digestive organs: Secondary | ICD-10-CM | POA: Diagnosis not present

## 2020-05-14 DIAGNOSIS — R102 Pelvic and perineal pain: Secondary | ICD-10-CM | POA: Diagnosis not present

## 2020-05-14 DIAGNOSIS — G8929 Other chronic pain: Secondary | ICD-10-CM | POA: Diagnosis not present

## 2020-05-14 DIAGNOSIS — N888 Other specified noninflammatory disorders of cervix uteri: Secondary | ICD-10-CM | POA: Diagnosis not present

## 2020-05-14 DIAGNOSIS — Z20822 Contact with and (suspected) exposure to covid-19: Secondary | ICD-10-CM | POA: Insufficient documentation

## 2020-05-14 DIAGNOSIS — Z823 Family history of stroke: Secondary | ICD-10-CM | POA: Diagnosis not present

## 2020-05-14 DIAGNOSIS — Z881 Allergy status to other antibiotic agents status: Secondary | ICD-10-CM | POA: Diagnosis not present

## 2020-05-14 DIAGNOSIS — Z79899 Other long term (current) drug therapy: Secondary | ICD-10-CM | POA: Diagnosis not present

## 2020-05-14 DIAGNOSIS — N736 Female pelvic peritoneal adhesions (postinfective): Secondary | ICD-10-CM | POA: Diagnosis not present

## 2020-05-14 DIAGNOSIS — Z885 Allergy status to narcotic agent status: Secondary | ICD-10-CM | POA: Diagnosis not present

## 2020-05-14 DIAGNOSIS — Z87891 Personal history of nicotine dependence: Secondary | ICD-10-CM | POA: Diagnosis not present

## 2020-05-14 NOTE — Telephone Encounter (Signed)
Call to patient to notify of surgery time change for 12/28 at 0730 at [facility], arrive at 0600.   Patient has additional questions. Patient is asking if appendix can be removed at time of surgery if endometriosis is suspected? Advised patient that type of surgery would need to be done by a general surgeon at a later time if needed. Patient will plan to speak with Dr. Edward Jolly prior to surgery. No additional questions. Advised I will forward to Dr. Edward Jolly for review, our office will call if any additional recommendations, patient agreeable.   Routing to provider for final review. Patient is agreeable to disposition. Will close encounter.

## 2020-05-15 ENCOUNTER — Ambulatory Visit (HOSPITAL_BASED_OUTPATIENT_CLINIC_OR_DEPARTMENT_OTHER)
Admission: RE | Admit: 2020-05-15 | Discharge: 2020-05-15 | Disposition: A | Discharge status: Home / Self Care | Payer: BC Managed Care – PPO | Attending: Obstetrics and Gynecology | Admitting: Obstetrics and Gynecology

## 2020-05-15 ENCOUNTER — Ambulatory Visit (HOSPITAL_BASED_OUTPATIENT_CLINIC_OR_DEPARTMENT_OTHER): Payer: BC Managed Care – PPO | Admitting: Certified Registered"

## 2020-05-15 ENCOUNTER — Encounter (HOSPITAL_BASED_OUTPATIENT_CLINIC_OR_DEPARTMENT_OTHER)
Admission: RE | Disposition: A | Discharge status: Home / Self Care | Payer: Self-pay | Source: Home / Self Care | Attending: Obstetrics and Gynecology

## 2020-05-15 ENCOUNTER — Encounter (HOSPITAL_BASED_OUTPATIENT_CLINIC_OR_DEPARTMENT_OTHER): Payer: Self-pay | Admitting: Obstetrics and Gynecology

## 2020-05-15 DIAGNOSIS — Z885 Allergy status to narcotic agent status: Secondary | ICD-10-CM | POA: Insufficient documentation

## 2020-05-15 DIAGNOSIS — Z87891 Personal history of nicotine dependence: Secondary | ICD-10-CM | POA: Diagnosis not present

## 2020-05-15 DIAGNOSIS — N736 Female pelvic peritoneal adhesions (postinfective): Secondary | ICD-10-CM | POA: Diagnosis not present

## 2020-05-15 DIAGNOSIS — Z20822 Contact with and (suspected) exposure to covid-19: Secondary | ICD-10-CM | POA: Insufficient documentation

## 2020-05-15 DIAGNOSIS — Z79899 Other long term (current) drug therapy: Secondary | ICD-10-CM | POA: Diagnosis not present

## 2020-05-15 DIAGNOSIS — Z881 Allergy status to other antibiotic agents status: Secondary | ICD-10-CM | POA: Insufficient documentation

## 2020-05-15 DIAGNOSIS — N888 Other specified noninflammatory disorders of cervix uteri: Secondary | ICD-10-CM | POA: Diagnosis not present

## 2020-05-15 DIAGNOSIS — F909 Attention-deficit hyperactivity disorder, unspecified type: Secondary | ICD-10-CM | POA: Diagnosis not present

## 2020-05-15 DIAGNOSIS — Z833 Family history of diabetes mellitus: Secondary | ICD-10-CM | POA: Insufficient documentation

## 2020-05-15 DIAGNOSIS — G8929 Other chronic pain: Secondary | ICD-10-CM | POA: Insufficient documentation

## 2020-05-15 DIAGNOSIS — Z882 Allergy status to sulfonamides status: Secondary | ICD-10-CM | POA: Insufficient documentation

## 2020-05-15 DIAGNOSIS — Z800 Family history of malignant neoplasm of digestive organs: Secondary | ICD-10-CM | POA: Insufficient documentation

## 2020-05-15 DIAGNOSIS — R102 Pelvic and perineal pain: Secondary | ICD-10-CM | POA: Insufficient documentation

## 2020-05-15 DIAGNOSIS — F418 Other specified anxiety disorders: Secondary | ICD-10-CM | POA: Diagnosis not present

## 2020-05-15 DIAGNOSIS — Z823 Family history of stroke: Secondary | ICD-10-CM | POA: Diagnosis not present

## 2020-05-15 DIAGNOSIS — N858 Other specified noninflammatory disorders of uterus: Secondary | ICD-10-CM | POA: Diagnosis not present

## 2020-05-15 DIAGNOSIS — K660 Peritoneal adhesions (postprocedural) (postinfection): Secondary | ICD-10-CM | POA: Diagnosis not present

## 2020-05-15 HISTORY — PX: LAPAROSCOPY: SHX197

## 2020-05-15 HISTORY — DX: Migraine, unspecified, not intractable, without status migrainosus: G43.909

## 2020-05-15 HISTORY — DX: Presence of spectacles and contact lenses: Z97.3

## 2020-05-15 HISTORY — DX: Other chronic pain: G89.29

## 2020-05-15 LAB — CBC
HCT: 44.1 % (ref 36.0–46.0)
Hemoglobin: 14.6 g/dL (ref 12.0–15.0)
MCH: 29.7 pg (ref 26.0–34.0)
MCHC: 33.1 g/dL (ref 30.0–36.0)
MCV: 89.6 fL (ref 80.0–100.0)
Platelets: 446 10*3/uL — ABNORMAL HIGH (ref 150–400)
RBC: 4.92 MIL/uL (ref 3.87–5.11)
RDW: 12.8 % (ref 11.5–15.5)
WBC: 9.7 10*3/uL (ref 4.0–10.5)
nRBC: 0 % (ref 0.0–0.2)

## 2020-05-15 LAB — SARS CORONAVIRUS 2 (TAT 6-24 HRS): SARS Coronavirus 2: NEGATIVE

## 2020-05-15 LAB — POCT PREGNANCY, URINE: Preg Test, Ur: NEGATIVE

## 2020-05-15 SURGERY — LAPAROSCOPY, DIAGNOSTIC
Anesthesia: General | Site: Abdomen

## 2020-05-15 MED ORDER — IBUPROFEN 800 MG PO TABS: 800.0000 mg | ORAL_TABLET | Freq: Three times a day (TID) | ORAL | 1 refills | Status: DC | PRN

## 2020-05-15 MED ORDER — ONDANSETRON HCL 4 MG/2ML IJ SOLN
INTRAMUSCULAR | Status: DC | PRN
  Administered 2020-05-15: 4 mg via INTRAVENOUS

## 2020-05-15 MED ORDER — DIPHENHYDRAMINE HCL 50 MG/ML IJ SOLN
INTRAMUSCULAR | Status: DC | PRN
  Administered 2020-05-15: 25 mg via INTRAVENOUS

## 2020-05-15 MED ORDER — PROPOFOL 10 MG/ML IV BOLUS
INTRAVENOUS | Status: DC | PRN
  Administered 2020-05-15: 200 mg via INTRAVENOUS

## 2020-05-15 MED ORDER — LIDOCAINE 2% (20 MG/ML) 5 ML SYRINGE
INTRAMUSCULAR | Status: DC | PRN
  Administered 2020-05-15: 60 mg via INTRAVENOUS

## 2020-05-15 MED ORDER — LIDOCAINE HCL (PF) 2 % IJ SOLN
INTRAMUSCULAR | Status: AC
  Filled 2020-05-15: qty 5

## 2020-05-15 MED ORDER — ONDANSETRON HCL 4 MG PO TABS: 4.0000 mg | ORAL_TABLET | Freq: Two times a day (BID) | ORAL | 0 refills | Status: DC | PRN

## 2020-05-15 MED ORDER — BUPIVACAINE HCL (PF) 0.25 % IJ SOLN
INTRAMUSCULAR | Status: DC | PRN
  Administered 2020-05-15: 6 mL

## 2020-05-15 MED ORDER — PROPOFOL 500 MG/50ML IV EMUL
INTRAVENOUS | Status: DC | PRN
  Administered 2020-05-15: 200 ug/kg/min via INTRAVENOUS

## 2020-05-15 MED ORDER — KETOROLAC TROMETHAMINE 30 MG/ML IJ SOLN
INTRAMUSCULAR | Status: AC
  Filled 2020-05-15: qty 1

## 2020-05-15 MED ORDER — OXYCODONE-ACETAMINOPHEN 5-325 MG PO TABS: 1.0000 | ORAL_TABLET | ORAL | 0 refills | Status: DC | PRN

## 2020-05-15 MED ORDER — PROPOFOL 500 MG/50ML IV EMUL
INTRAVENOUS | Status: AC
  Filled 2020-05-15: qty 50

## 2020-05-15 MED ORDER — FENTANYL CITRATE (PF) 250 MCG/5ML IJ SOLN
INTRAMUSCULAR | Status: DC | PRN
  Administered 2020-05-15 (×2): 50 ug via INTRAVENOUS

## 2020-05-15 MED ORDER — GABAPENTIN 300 MG PO CAPS
ORAL_CAPSULE | ORAL | Status: AC
  Filled 2020-05-15: qty 1

## 2020-05-15 MED ORDER — SCOPOLAMINE 1 MG/3DAYS TD PT72
1.0000 | MEDICATED_PATCH | TRANSDERMAL | Status: DC
  Administered 2020-05-15: 07:00:00 1.5 mg via TRANSDERMAL

## 2020-05-15 MED ORDER — ROCURONIUM BROMIDE 10 MG/ML (PF) SYRINGE
PREFILLED_SYRINGE | INTRAVENOUS | Status: DC | PRN
  Administered 2020-05-15: 60 mg via INTRAVENOUS

## 2020-05-15 MED ORDER — ACETAMINOPHEN 500 MG PO TABS: 1000.0000 mg | ORAL_TABLET | ORAL | Status: DC

## 2020-05-15 MED ORDER — ACETAMINOPHEN 10 MG/ML IV SOLN
INTRAVENOUS | Status: DC | PRN
  Administered 2020-05-15: 1000 mg via INTRAVENOUS

## 2020-05-15 MED ORDER — SCOPOLAMINE 1 MG/3DAYS TD PT72
MEDICATED_PATCH | TRANSDERMAL | Status: AC
  Filled 2020-05-15: qty 1

## 2020-05-15 MED ORDER — KETOROLAC TROMETHAMINE 30 MG/ML IJ SOLN
30.0000 mg | Freq: Once | INTRAMUSCULAR | Status: AC
  Administered 2020-05-15: 30 mg via INTRAVENOUS

## 2020-05-15 MED ORDER — MIDAZOLAM HCL 5 MG/5ML IJ SOLN
INTRAMUSCULAR | Status: DC | PRN
  Administered 2020-05-15: 4 mg via INTRAVENOUS

## 2020-05-15 MED ORDER — SUGAMMADEX SODIUM 200 MG/2ML IV SOLN
INTRAVENOUS | Status: DC | PRN
  Administered 2020-05-15: 200 mg via INTRAVENOUS

## 2020-05-15 MED ORDER — ACETAMINOPHEN 500 MG PO TABS
ORAL_TABLET | ORAL | Status: AC
  Filled 2020-05-15: qty 2

## 2020-05-15 MED ORDER — ONDANSETRON HCL 4 MG/2ML IJ SOLN
INTRAMUSCULAR | Status: AC
  Filled 2020-05-15: qty 2

## 2020-05-15 MED ORDER — FENTANYL CITRATE (PF) 100 MCG/2ML IJ SOLN: 25.0000 ug | INTRAMUSCULAR | Status: DC | PRN

## 2020-05-15 MED ORDER — ACETAMINOPHEN 10 MG/ML IV SOLN
INTRAVENOUS | Status: AC
  Filled 2020-05-15: qty 100

## 2020-05-15 MED ORDER — FENTANYL CITRATE (PF) 100 MCG/2ML IJ SOLN
INTRAMUSCULAR | Status: AC
  Filled 2020-05-15: qty 2

## 2020-05-15 MED ORDER — DEXAMETHASONE SODIUM PHOSPHATE 10 MG/ML IJ SOLN
INTRAMUSCULAR | Status: DC | PRN
  Administered 2020-05-15: 10 mg via INTRAVENOUS

## 2020-05-15 MED ORDER — MIDAZOLAM HCL 2 MG/2ML IJ SOLN
INTRAMUSCULAR | Status: AC
  Filled 2020-05-15: qty 2

## 2020-05-15 MED ORDER — PROPOFOL 10 MG/ML IV BOLUS
INTRAVENOUS | Status: AC
  Filled 2020-05-15: qty 20

## 2020-05-15 MED ORDER — DEXAMETHASONE SODIUM PHOSPHATE 10 MG/ML IJ SOLN
INTRAMUSCULAR | Status: AC
  Filled 2020-05-15: qty 1

## 2020-05-15 MED ORDER — LACTATED RINGERS IV SOLN: INTRAVENOUS | Status: DC

## 2020-05-15 MED ORDER — GABAPENTIN 300 MG PO CAPS: 300.0000 mg | ORAL_CAPSULE | ORAL | Status: DC

## 2020-05-15 MED ORDER — POVIDONE-IODINE 10 % EX SWAB: 2.0000 "application " | Freq: Once | CUTANEOUS | Status: DC

## 2020-05-15 MED ORDER — DIPHENHYDRAMINE HCL 50 MG/ML IJ SOLN
INTRAMUSCULAR | Status: AC
  Filled 2020-05-15: qty 1

## 2020-05-15 SURGICAL SUPPLY — 66 items
ADH SKN CLS APL DERMABOND .7 (GAUZE/BANDAGES/DRESSINGS) ×2
BAG RETRIEVAL 10 (BASKET)
BAG RETRIEVAL 10MM (BASKET)
BLADE SURG 15 STRL LF DISP TIS (BLADE) IMPLANT
BLADE SURG 15 STRL SS (BLADE)
CABLE HIGH FREQUENCY MONO STRZ (ELECTRODE) IMPLANT
CANISTER SUCT 3000ML PPV (MISCELLANEOUS) ×4 IMPLANT
COVER MAYO STAND STRL (DRAPES) ×4 IMPLANT
COVER WAND RF STERILE (DRAPES) ×4 IMPLANT
DERMABOND ADVANCED (GAUZE/BANDAGES/DRESSINGS) ×2
DERMABOND ADVANCED .7 DNX12 (GAUZE/BANDAGES/DRESSINGS) ×2 IMPLANT
DRSG COVADERM PLUS 2X2 (GAUZE/BANDAGES/DRESSINGS) IMPLANT
DRSG OPSITE POSTOP 3X4 (GAUZE/BANDAGES/DRESSINGS) IMPLANT
DURAPREP 26ML APPLICATOR (WOUND CARE) ×4 IMPLANT
ELECT REM PT RETURN 9FT ADLT (ELECTROSURGICAL)
ELECTRODE REM PT RTRN 9FT ADLT (ELECTROSURGICAL) IMPLANT
GAUZE 4X4 16PLY RFD (DISPOSABLE) ×4 IMPLANT
GLOVE BIO SURGEON STRL SZ 6.5 (GLOVE) ×6 IMPLANT
GLOVE BIO SURGEON STRL SZ7 (GLOVE) ×4 IMPLANT
GLOVE BIO SURGEONS STRL SZ 6.5 (GLOVE) ×2
GLOVE BIOGEL M 6.5 STRL (GLOVE) ×4 IMPLANT
GLOVE BIOGEL PI IND STRL 7.0 (GLOVE) ×6 IMPLANT
GLOVE BIOGEL PI IND STRL 7.5 (GLOVE) ×4 IMPLANT
GLOVE BIOGEL PI INDICATOR 7.0 (GLOVE) ×6
GLOVE BIOGEL PI INDICATOR 7.5 (GLOVE) ×4
GOWN STRL REUS W/TWL LRG LVL3 (GOWN DISPOSABLE) ×16 IMPLANT
GOWN STRL REUS W/TWL XL LVL3 (GOWN DISPOSABLE) ×4 IMPLANT
HOLDER FOLEY CATH W/STRAP (MISCELLANEOUS) IMPLANT
KIT TURNOVER CYSTO (KITS) ×4 IMPLANT
LIGASURE VESSEL 5MM BLUNT TIP (ELECTROSURGICAL) IMPLANT
NEEDLE HYPO 22GX1.5 SAFETY (NEEDLE) ×4 IMPLANT
NEEDLE INSUFFLATION 120MM (ENDOMECHANICALS) ×4 IMPLANT
NEEDLE SPNL 22GX3.5 QUINCKE BK (NEEDLE) IMPLANT
NS IRRIG 1000ML POUR BTL (IV SOLUTION) IMPLANT
NS IRRIG 500ML POUR BTL (IV SOLUTION) ×4 IMPLANT
PACK LAPAROSCOPY BASIN (CUSTOM PROCEDURE TRAY) ×4 IMPLANT
PACK TRENDGUARD 450 HYBRID PRO (MISCELLANEOUS) ×2 IMPLANT
PACK VAGINAL MINOR WOMEN LF (CUSTOM PROCEDURE TRAY) ×4 IMPLANT
PAD OB MATERNITY 4.3X12.25 (PERSONAL CARE ITEMS) ×4 IMPLANT
PAD PREP 24X48 CUFFED NSTRL (MISCELLANEOUS) ×4 IMPLANT
PENCIL SMOKE EVACUATOR (MISCELLANEOUS) ×4 IMPLANT
POUCH LAPAROSCOPIC INSTRUMENT (MISCELLANEOUS) ×4 IMPLANT
PROTECTOR NERVE ULNAR (MISCELLANEOUS) ×8 IMPLANT
SCISSORS LAP 5X35 DISP (ENDOMECHANICALS) IMPLANT
SET SUCTION IRRIG HYDROSURG (IRRIGATION / IRRIGATOR) IMPLANT
SET TUBE SMOKE EVAC HIGH FLOW (TUBING) ×4 IMPLANT
SHEARS HARMONIC ACE PLUS 36CM (ENDOMECHANICALS) ×4 IMPLANT
SLEEVE ADV FIXATION 5X100MM (TROCAR) IMPLANT
SUT PLAIN 3 0 FS 2 27 (SUTURE) IMPLANT
SUT VIC AB 2-0 SH 27 (SUTURE) ×4
SUT VIC AB 2-0 SH 27XBRD (SUTURE) ×2 IMPLANT
SUT VIC AB 4-0 SH 27 (SUTURE) ×4
SUT VIC AB 4-0 SH 27XANBCTRL (SUTURE) ×2 IMPLANT
SUT VICRYL 0 UR6 27IN ABS (SUTURE) IMPLANT
SUT VICRYL RAPIDE 4/0 PS 2 (SUTURE) IMPLANT
SYR CONTROL 10ML LL (SYRINGE) IMPLANT
SYS BAG RETRIEVAL 10MM (BASKET)
SYSTEM BAG RETRIEVAL 10MM (BASKET) IMPLANT
SYSTEM CARTER THOMASON II (TROCAR) IMPLANT
TOWEL OR 17X26 10 PK STRL BLUE (TOWEL DISPOSABLE) ×12 IMPLANT
TRAY FOLEY W/BAG SLVR 14FR LF (SET/KITS/TRAYS/PACK) ×4 IMPLANT
TRENDGUARD 450 HYBRID PRO PACK (MISCELLANEOUS) ×4
TROCAR ADV FIXATION 5X100MM (TROCAR) ×4 IMPLANT
TROCAR BLADELESS OPT 5 100 (ENDOMECHANICALS) ×12 IMPLANT
TROCAR XCEL NON-BLD 11X100MML (ENDOMECHANICALS) IMPLANT
WARMER LAPAROSCOPE (MISCELLANEOUS) ×4 IMPLANT

## 2020-05-15 NOTE — Anesthesia Preprocedure Evaluation (Signed)
Anesthesia Evaluation  Patient identified by MRN, date of birth, ID band Patient awake    Reviewed: Allergy & Precautions, NPO status , Patient's Chart, lab work & pertinent test results  History of Anesthesia Complications (+) PONV  Airway Mallampati: II  TM Distance: >3 FB Neck ROM: Full    Dental  (+) Missing, Chipped, Dental Advisory Given,    Pulmonary neg pulmonary ROS,    Pulmonary exam normal breath sounds clear to auscultation       Cardiovascular negative cardio ROS Normal cardiovascular exam Rhythm:Regular Rate:Normal     Neuro/Psych  Headaches, PSYCHIATRIC DISORDERS Anxiety Depression    GI/Hepatic Neg liver ROS, GERD  Medicated and Controlled,  Endo/Other  negative endocrine ROS  Renal/GU negative Renal ROS  negative genitourinary   Musculoskeletal negative musculoskeletal ROS (+)   Abdominal   Peds  (+) ADHD Hematology negative hematology ROS (+)   Anesthesia Other Findings   Reproductive/Obstetrics                             Anesthesia Physical Anesthesia Plan  ASA: II  Anesthesia Plan: General   Post-op Pain Management:    Induction: Intravenous  PONV Risk Score and Plan: 4 or greater and Midazolam, Dexamethasone, Ondansetron, Scopolamine patch - Pre-op, Propofol infusion and TIVA  Airway Management Planned: Oral ETT  Additional Equipment:   Intra-op Plan:   Post-operative Plan: Extubation in OR  Informed Consent: I have reviewed the patients History and Physical, chart, labs and discussed the procedure including the risks, benefits and alternatives for the proposed anesthesia with the patient or authorized representative who has indicated his/her understanding and acceptance.     Dental advisory given  Plan Discussed with: CRNA  Anesthesia Plan Comments:         Anesthesia Quick Evaluation

## 2020-05-15 NOTE — Transfer of Care (Signed)
Immediate Anesthesia Transfer of Care Note  Patient: Debbie Warner  Procedure(s) Performed: LAPAROSCOPY DIAGNOSTIC , LYSIS  OF ENDOMETRIOSIS, REMOVAL OF CERVICAL GRANULATED TISSUE (N/A Abdomen) removal of cervical polyp verses vaginal granulation tissue (N/A )  Patient Location: PACU  Anesthesia Type:General  Level of Consciousness: awake, alert , oriented and patient cooperative  Airway & Oxygen Therapy: Patient Spontanous Breathing and Patient connected to face mask oxygen  Post-op Assessment: Report given to RN, Post -op Vital signs reviewed and stable and Patient moving all extremities  Post vital signs: Reviewed and stable  Last Vitals:  Vitals Value Taken Time  BP 114/57 05/15/20 0839  Temp 36.7 C 05/15/20 0839  Pulse 86 05/15/20 0842  Resp 16 05/15/20 0842  SpO2 100 % 05/15/20 0842  Vitals shown include unvalidated device data.  Last Pain:  Vitals:   05/15/20 0620  TempSrc: Oral  PainSc: 0-No pain      Patients Stated Pain Goal: 5 (05/15/20 0017)  Complications: No complications documented.

## 2020-05-15 NOTE — Op Note (Signed)
OPERATIVE REPORT  PREOPERATIVE DIAGNOSES:   Chronic pelvic pain  POSTOPERATIVE DIAGNOSES:   Chronic pelvic pain, adhesions of right adnexa, cecum, and sigmoid colon   PROCEDURES:   Laparoscopy with lysis of adhesions, removal of cervical granulation tissue.  SURGEON:  Randye Lobo, M.D.  ASSISTANT:   Tobi Bastos, M.D.  ANESTHESIA:  General endotracheal, local with 0.25% Marcaine, IV Tylenol.  EBL:  3 cc.   URINE OUTPUT:   50 cc.   IV FLUIDS:   800 cc.   COMPLICATIONS:  None.  INDICATIONS FOR THE PROCEDURE:  The patient is a 24 year old Gravida 0 Caucasian female who presents with chronic pelvic pain, right side greater than left side.  The patient has a history of adhesive disease removed by prior laparoscopy.  She has a Mirena IUD and had an unremarkable pelvic ultrasound.  She has seen gastroenterology and continues her work up for painful bowel movements and nausea.  A CT of the abdomen and pelvis was unremarkable as well.  She has known granulation tissue of the cervix likely due to her prior LEEP procedure and requests removal. The  patient desires surgical evaluation of her pain, and a plan is made to proceed now with a laparoscopy with possible treatment of adhesive disease and endometriosis after risks, benefits, and alternatives are reviewed with the patient.  FINDINGS:  Examination under anesthesia revealed a small uterus and no adnexal masses.   Laparoscopy demonstrated a normal uterus and bilateral tubes and ovaries.  The was a thin band of adhesion between the right adnexa and the right pelvic sidewall.  There were adhesions of the sigmoid colon to the left pelvic sidewall and the cecum to the right pelvic sidewall.  There was no endometriosis in the abdomen or pelvis.  The liver, gall bladder, appendix, omentum, bowel, and peritoneal surfaces were normal.  There was no evidence of ascites.  The cervix had a 7 mm area of fleshy granulation tissue on the surface, which  otherwise had no gross lesions.   SPECIMENS:  The cervical granulation tissue was sent to pathology.    DESCRIPTION OF PROCEDURE:  The patient was reidentified in the preoperative hold area.  She received TED hose and PAS stockings for DVT prophylaxis.  The patient was transferred to the operating room where she was placed in the dorsal lithotomy position with Allen stirrups.  General endotracheal anesthesia was induced.  The Trendgard was used to support the patient on the OR table.   An examination under anesthesia was performed.  The patient's lower abdomen, vagina and perineum were then sterilely prepped and she was draped.  A Foley catheter was sterilely placed inside the bladder and left to gravity drainage throughout the procedure and was removed at the termination of the procedure.    A speculum was placed in the vagina and the granulation tissue was removed with a ring forceps and sent to pathology.  Hemostasis was good.   Attention was turned to the abdomen where the umbilical region was injected with 0.25% Marcaine and a small incision created.  Penetrating towel clips were used to raise the anterior abdominal wall.  A Veress needle was then used to insufflate the abdomen with CO2 gas after a saline drop test was performed and the fluid flowed freely.   A 5 mm camera port was then placed using the Optiview.  5 mm trocars were placed in a right lower abdominal and left lower abdominal incision under visualization of the laparoscope  after the skin was injected with Marcaine and incised with a scalpel.        The patient was placed in Trendelenburg position.  An inspection of the abdomen and pelvis was performed. The findings are as noted above.   Lysis of all adhesions was performed with the harmonic scalpel.    Hemostasis was good.     The laparoscope was removed.  The pneumoperitoneum was released, and the patient received manual breaths to remove any remaining CO2 gas.  The trocars  were removed.   The trocar sites were closed with subcuticular sutures of 4/0 Rapide Vicryl.  All incisions were then closed with Dermabond.   The patient was awakened and extubated, and escorted to the recovery room in stable condition.  There were no complications.  All needle, instrument, and sponge counts were correct.  An MD assistant was necessary for tissue manipulation, management of instrumentation, retraction and positioning due to the complexity of the case.  Randye Lobo, M.D.

## 2020-05-15 NOTE — Progress Notes (Signed)
Update to History and Physical  No marked change in status since office preop visit.  Still has right lower quadrant pain and nausea.  She had a CT of the abdomen and pelvis in May, 2021 which showed a normal appendix. (See Imiaging in Epic.) Patient examined.  OK to proceed with surgery.

## 2020-05-15 NOTE — Anesthesia Procedure Notes (Signed)
Procedure Name: Intubation Date/Time: 05/15/2020 7:34 AM Performed by: Myna Bright, CRNA Pre-anesthesia Checklist: Patient identified, Emergency Drugs available, Suction available and Patient being monitored Patient Re-evaluated:Patient Re-evaluated prior to induction Oxygen Delivery Method: Circle system utilized Preoxygenation: Pre-oxygenation with 100% oxygen Induction Type: IV induction Ventilation: Mask ventilation without difficulty Laryngoscope Size: Mac and 3 Grade View: Grade I Tube type: Oral Tube size: 7.0 mm Number of attempts: 1 Airway Equipment and Method: Stylet Placement Confirmation: ETT inserted through vocal cords under direct vision,  positive ETCO2 and breath sounds checked- equal and bilateral Secured at: 21 cm Tube secured with: Tape Dental Injury: Teeth and Oropharynx as per pre-operative assessment

## 2020-05-15 NOTE — H&P (Signed)
Office Visit  04/18/2020 [facility]   Ardell Isaacs, [name], MD  Obstetrics and Gynecology  Pelvic pain +1 more  Dx    Additional Documentation  Vitals:  BP 114/68  Pulse 60  Resp 12  Ht 5' 6.5" (1.689 m)  Wt 64.9 kg  SpO2 99%  BMI 22.74 kg/m  BSA 1.75 m    More Vitals  Flowsheets:  NEWS,  MEWS Score,  Anthropometrics,  Vital Signs,  Method of Visit    Encounter Info:  Billing Info,  History,  Allergies,  Detailed Report     All Notes    Progress Notes by Patton Salles, MD at 04/18/2020 11:00 AM  Author: Patton Salles, MD Author Type: Physician Filed: 04/21/2020  5:58 PM  Note Status: Signed Cosign: Cosign Not Required Encounter Date: 04/18/2020  Editor: Patton Salles, MD (Physician)      Prior Versions: 1. Patton Salles, MD (Physician) at 04/18/2020 11:38 AM - Sign when Signing Visit   2. Loney Hering, Shanon C at 04/18/2020 11:09 AM - Sign when Signing Visit   3. Lum Babe CMA (Certified Engineer, site) at 04/18/2020  9:21 AM - Sign when Signing Visit    GYNECOLOGY  VISIT   HPI:  24 y.o.   Single  Caucasian  female   [id number] with No LMP recorded. (Menstrual status: IUD).   here for  Surgery consult. She states that she has some white discharge and asked to leave a urine. She denies any UTI symptoms but said that she wanted to just have it checked.  No partner change.    Patient has chronic RLQ pain, which has spread to her left side.  She has painful BMs.  She does have loose stool, but not diarrhea.  She is feels like something in pulling on her right and left side.  She cannot sleep on her back due to the pain.    She smokes marijuana to relieve the pain.  Aleve helps the pain, and Bentyl does not necessarily help.  She does not have menstruation with her Mirena IUD. The pain has increased intensity at the end of the month.  She has nausea which can  last for a few days.  She takes Zofran for nausea.   She had an emptying test with her GI in October, Dr. Guillermina City at [place]. She would like to transfer her care to Dr. Elnoria Howard.    Hx laparoscopy with LOA in 2016.    Pelvic US 11/24/19: Uterus no masses.  EMS 2.31 mm. IUD in endometrial canal.  Ovaries normal.  No free fluid.   Patient has nausea with general anesthesia.  Scopolamine patch helps this.   She has a psychiatrist and therapist and is working on her mental health.    Urine dip:  Negative.    GYNECOLOGIC HISTORY: No LMP recorded. (Menstrual status: IUD). Contraception:  Mirena IUD Menopausal hormone therapy:  n/a Last mammogram:  n/a Last pap smear:   12/27/19 LEEP showed CIN II with Neg margins; 10/31/19 LGSIL:Pos HR HPV; colpo showed HGSIL                OB History     Gravida  0   Para  0   Term  0   Preterm  0   AB  0   Living  0      SAB  0   TAB  0  Ectopic  0   Multiple  0   Live Births  0                 There are no problems to display for this patient.         Past Medical History:  Diagnosis Date  . ADHD    . Anxiety      hx hives with anxiety  . Complication of anesthesia      wants scopolamine patch  . Depression    . Dysmenorrhea    . Endometriosis    . GERD (gastroesophageal reflux disease)    . Headache      migraines  . High grade squamous intraepithelial cervical dysplasia 11/2019    Endocervical glandular extension  . History of abnormal cervical Pap smear    . History of suicidal tendencies 2019  . IBS (irritable bowel syndrome)    . IUD (intrauterine device) in place      IUD threads cut short for LEEP procedure done 12/27/19  . PONV (postoperative nausea and vomiting)             Past Surgical History:  Procedure Laterality Date  . COLPOSCOPY N/A 12/27/2019    Procedure: COLPOSCOPY;  Surgeon: Patton Salles, MD;  Location: [facility];  Service: Gynecology;  Laterality: N/A;  .  fractured arm Right 2008    sx done  . LEEP N/A 12/27/2019    Procedure: LOOP ELECTROSURGICAL EXCISION PROCEDURE (LEEP)/ECC;  Surgeon: Patton Salles, MD;  Location: [facility];  Service: Gynecology;  Laterality: N/A;  . PELVIC LAPAROSCOPY   2016    dx'd with endometriosis per patient--[place], [place].  Op report indicates lysis of adhesions.   . TONSILLECTOMY   as child            Current Outpatient Medications  Medication Sig Dispense Refill  . dicyclomine (BENTYL) 10 MG capsule Take 10 mg by mouth as needed.       Marland Kitchen esomeprazole (NEXIUM) 20 MG packet Take 20 mg by mouth daily before breakfast.      . hydrOXYzine (ATARAX/VISTARIL) 25 MG tablet Take 25 mg by mouth as needed. (Patient not taking: Reported on 01/25/2020)      . levonorgestrel (MIRENA) 20 MCG/24HR IUD 1 each by Intrauterine route once. Inserted 2019      . mirtazapine (REMERON) 15 MG tablet Take 15 mg by mouth at bedtime.      . ondansetron (ZOFRAN) 4 MG tablet Take 4 mg by mouth 2 (two) times daily as needed.       . Vitamin D, Ergocalciferol, (DRISDOL) 1.25 MG (50000 UNIT) CAPS capsule TAKE 1 CAPSULE BY MOUTH EVERY 7 DAYS 2 capsule 0    No current facility-administered medications for this visit.      ALLERGIES: Hydrocodone, Macrobid [nitrofurantoin], and Sulfa antibiotics        Family History  Problem Relation Age of Onset  . Diabetes Mother    . Cancer Maternal Grandmother 75        Dec from colon cancer  . Diabetes Maternal Grandfather    . Stroke Maternal Grandfather    . Stroke Maternal Aunt        Social History         Socioeconomic History  . Marital status: Single      Spouse name: Not on file  . Number of children: Not on file  . Years of education: Not  on file  . Highest education level: Not on file  Occupational History  . Not on file  Tobacco Use  . Smoking status: Never Smoker  . Smokeless tobacco: Never Used  Vaping Use  . Vaping Use: Every day  . Last attempt to  quit: 05/20/2019  . Substances: Nicotine  . Devices: quit nicotine jan 2021 vapes cannabus now  Substance and Sexual Activity  . Alcohol use: Not Currently  . Drug use: Yes      Frequency: 1.0 times per week      Types: Marijuana      Comment: uses for nausea last used 12-20-2019  . Sexual activity: Not Currently      Birth control/protection: I.U.D.      Comment: Mirena IUD 05/2017  Other Topics Concern  . Not on file  Social History Narrative  . Not on file    Social Determinants of Health       Financial Resource Strain:   . Difficulty of Paying Living Expenses: Not on file  Food Insecurity:   . Worried About Programme researcher, broadcasting/film/video in the Last Year: Not on file  . Ran Out of Food in the Last Year: Not on file  Transportation Needs:   . Lack of Transportation (Medical): Not on file  . Lack of Transportation (Non-Medical): Not on file  Physical Activity:   . Days of Exercise per Week: Not on file  . Minutes of Exercise per Session: Not on file  Stress:   . Feeling of Stress : Not on file  Social Connections:   . Frequency of Communication with Friends and Family: Not on file  . Frequency of Social Gatherings with Friends and Family: Not on file  . Attends Religious Services: Not on file  . Active Member of Clubs or Organizations: Not on file  . Attends Banker Meetings: Not on file  . Marital Status: Not on file  Intimate Partner Violence:   . Fear of Current or Ex-Partner: Not on file  . Emotionally Abused: Not on file  . Physically Abused: Not on file  . Sexually Abused: Not on file      Review of Systems  All other systems reviewed and are negative.     PHYSICAL EXAMINATION:     BP 114/68   Pulse 60   Resp 12   Ht 5' 6.5" (1.689 m)   Wt 143 lb (64.9 kg)   SpO2 99%   BMI 22.74 kg/m     General appearance: alert, cooperative and appears stated age Head: Normocephalic, without obvious abnormality, atraumatic Lungs: clear to auscultation  bilaterally Heart: regular rate and rhythm Abdomen: soft, non-tender, no masses,  no organomegaly No abnormal inguinal nodes palpated Neurologic: Grossly normal   Pelvic: External genitalia:  no lesions              Urethra:  normal appearing urethra with no masses, tenderness or lesions              Bartholins and Skenes: normal                 Vagina: normal appearing vagina with normal color and discharge, no lesions              Cervix: no lesions.  4 mm polyp.   IUD strings not seen.                 Bimanual Exam:  Uterus:  normal size, contour, position, consistency,  mobility, non-tender              Adnexa: no mass, fullness, tenderness          Chaperone was present for exam.   ASSESSMENT   Pelvic pain.  Chronic RLQ pain.  New onset LLQ pain.  Hx adhesive disease.  Mirena IUD.  Strings cut short for LEEP. Cervical polyp versus granulation tissue.  Painful BMs.    PLAN   We reviewed benefits and risks of laparoscopy.  Risks of laparoscopy include but are not limited to bleeding, infection, damage to surrounding organs, reaction to anesthesia, pneumonia, DVT, PE, death, hernia formation, possible continued pain, and a negative laparoscopy.   Will plan for removal of cervical polyp or granulation tissue at time of laparoscopy.   Referral to Dr. Elnoria HowardHung.  She declines completing her GI evaluation prior to doing a laparoscopy.    30 min total time was spent for this patient encounter, including preparation, face-to-face counseling with the patient, coordination of care, and documentation of the encounter.

## 2020-05-15 NOTE — Anesthesia Postprocedure Evaluation (Signed)
Anesthesia Post Note  Patient: Debbie Warner  Procedure(s) Performed: LAPAROSCOPY DIAGNOSTIC , LYSIS  OF ENDOMETRIOSIS, REMOVAL OF CERVICAL GRANULATED TISSUE (N/A Abdomen)     Patient location during evaluation: PACU Anesthesia Type: General Level of consciousness: awake and alert Pain management: pain level controlled Vital Signs Assessment: post-procedure vital signs reviewed and stable Respiratory status: spontaneous breathing, nonlabored ventilation, respiratory function stable and patient connected to nasal cannula oxygen Cardiovascular status: blood pressure returned to baseline and stable Postop Assessment: no apparent nausea or vomiting Anesthetic complications: no   No complications documented.  Last Vitals:  Vitals:   05/15/20 0915 05/15/20 0930  BP: 114/79 108/70  Pulse: 75 67  Resp: 16 20  Temp:    SpO2: 98% 98%    Last Pain:  Vitals:   05/15/20 0930  TempSrc:   PainSc: 7                  Treniece Holsclaw L Toriano Aikey

## 2020-05-15 NOTE — Discharge Instructions (Signed)
Debbie Warner,   I found and removed scar tissue around your cecum, right tube and ovary and your sigmoid colon.  I did not find endometriosis.  I removed the granulation tissue from the cervix and sent it to the lab for analysis. Everything went well!  Conley Simmonds, MD    Laparoscopy, Care After This sheet gives you information about how to care for yourself after your procedure. Your health care provider may also give you more specific instructions. If you have problems or questions, contact your health care provider. What can I expect after the procedure? After the procedure, it is common to have:  Mild discomfort in the abdomen.  Sore throat. Women who have laparoscopy with pelvic examination may have mild cramping and fluid coming from the vagina for a few days after the procedure. Follow these instructions at home: Medicines  Take over-the-counter and prescription medicines only as told by your health care provider.  If you were prescribed an antibiotic medicine, take it as told by your health care provider. Do not stop taking the antibiotic even if you start to feel better. Driving  Do not drive for 24 hours if you were given a medicine to help you relax (sedative) during your procedure.  Do not drive or use heavy machinery while taking prescription pain medicine. Bathing  Do not take baths, swim, or use a hot tub until your health care provider approves. You may take showers. Incision care   Follow instructions from your health care provider about how to take care of your incisions. Make sure you: ? Wash your hands with soap and water before you change your bandage (dressing). If soap and water are not available, use hand sanitizer. ? Change your dressing as told by your health care provider. ? Leave stitches (sutures), skin glue, or adhesive strips in place. These skin closures may need to stay in place for 2 weeks or longer. If adhesive strip edges start to loosen and curl  up, you may trim the loose edges. Do not remove adhesive strips completely unless your health care provider tells you to do that.  Check your incision areas every day for signs of infection. Check for: ? Redness, swelling, or pain. ? Fluid or blood. ? Warmth. ? Pus or a bad smell. Activity  Return to your normal activities as told by your health care provider. Ask your health care provider what activities are safe for you.  Do not lift anything that is heavier than 10 lb (4.5 kg), or the limit that you are told, until your health care provider says that it is safe. General instructions  To prevent or treat constipation while you are taking prescription pain medicine, your health care provider may recommend that you: ? Drink enough fluid to keep your urine pale yellow. ? Take over-the-counter or prescription medicines. ? Eat foods that are high in fiber, such as fresh fruits and vegetables, whole grains, and beans. ? Limit foods that are high in fat and processed sugars, such as fried and sweet foods.  Do not use any products that contain nicotine or tobacco, such as cigarettes and e-cigarettes. If you need help quitting, ask your health care provider.  Keep all follow-up visits as told by your health care provider. This is important. Contact a health care provider if:  You develop shoulder pain.  You feel lightheaded or faint.  You are unable to pass gas or have a bowel movement.  You feel nauseous or you vomit.  You  develop a rash.  You have redness, swelling, or pain around any incision.  You have fluid or blood coming from any incision.  Any incision feels warm to the touch.  You have pus or a bad smell coming from any incision.  You have a fever or chills. Get help right away if:  You have severe pain.  You have vomiting that does not go away.  You have heavy bleeding from the vagina.  Any incision opens.  You have trouble breathing.  You have chest  pain. Summary  After the procedure, it is common to have mild discomfort in the abdomen and a sore throat.  Check your incision areas every day for signs of infection.  Return to your normal activities as told by your health care provider. Ask your health care provider what activities are safe for you. This information is not intended to replace advice given to you by your health care provider. Make sure you discuss any questions you have with your health care provider. Document Revised: 04/17/2017 Document Reviewed: 10/29/2016 Elsevier Patient Education  2020 [employer].    Post Anesthesia Home Care Instructions  Activity: Get plenty of rest for the remainder of the day. A responsible individual must stay with you for 24 hours following the procedure.  For the next 24 hours, DO NOT: -Drive a car -Advertising copywriter -Drink alcoholic beverages -Take any medication unless instructed by your physician -Make any legal decisions or sign important papers.  Meals: Start with liquid foods such as gelatin or soup. Progress to regular foods as tolerated. Avoid greasy, spicy, heavy foods. If nausea and/or vomiting occur, drink only clear liquids until the nausea and/or vomiting subsides. Call your physician if vomiting continues.  Special Instructions/Symptoms: Your throat may feel dry or sore from the anesthesia or the breathing tube placed in your throat during surgery. If this causes discomfort, gargle with warm salt water. The discomfort should disappear within 24 hours.  If you had a scopolamine patch placed behind your ear for the management of post- operative nausea and/or vomiting:  1. The medication in the patch is effective for 72 hours, after which it should be removed.  Wrap patch in a tissue and discard in the trash. Wash hands thoroughly with soap and water. 2. You may remove the patch earlier than 72 hours if you experience unpleasant side effects which may include dry mouth,  dizziness or visual disturbances. 3. Avoid touching the patch. Wash your hands with soap and water after contact with the patch.    Remove patch behind right ear by Friday, May 18, 2020.

## 2020-05-16 ENCOUNTER — Encounter (HOSPITAL_BASED_OUTPATIENT_CLINIC_OR_DEPARTMENT_OTHER): Payer: Self-pay | Admitting: Obstetrics and Gynecology

## 2020-05-16 LAB — SURGICAL PATHOLOGY

## 2020-05-21 DIAGNOSIS — F1010 Alcohol abuse, uncomplicated: Secondary | ICD-10-CM | POA: Diagnosis not present

## 2020-05-21 DIAGNOSIS — F4325 Adjustment disorder with mixed disturbance of emotions and conduct: Secondary | ICD-10-CM | POA: Diagnosis not present

## 2020-05-21 DIAGNOSIS — Z630 Problems in relationship with spouse or partner: Secondary | ICD-10-CM | POA: Diagnosis not present

## 2020-05-21 DIAGNOSIS — Z62820 Parent-biological child conflict: Secondary | ICD-10-CM | POA: Diagnosis not present

## 2020-05-22 ENCOUNTER — Telehealth: Payer: Self-pay

## 2020-05-22 NOTE — Telephone Encounter (Signed)
-----   Message from Patton Salles, MD sent at 05/17/2020  4:44 AM EST ----- Please inform patient that the surgical pathology specimen showed granulation tissue.

## 2020-05-22 NOTE — Telephone Encounter (Signed)
Left message to call Odester Nilson, CMA. °

## 2020-05-24 DIAGNOSIS — F900 Attention-deficit hyperactivity disorder, predominantly inattentive type: Secondary | ICD-10-CM | POA: Diagnosis not present

## 2020-05-24 DIAGNOSIS — F411 Generalized anxiety disorder: Secondary | ICD-10-CM | POA: Diagnosis not present

## 2020-05-24 DIAGNOSIS — F3181 Bipolar II disorder: Secondary | ICD-10-CM | POA: Diagnosis not present

## 2020-05-29 ENCOUNTER — Ambulatory Visit: Payer: Self-pay | Admitting: Obstetrics and Gynecology

## 2020-05-29 NOTE — Progress Notes (Deleted)
GYNECOLOGY  VISIT   HPI: 25 y.o.   Single  Caucasian  female   G0P0000 with No LMP recorded. (Menstrual status: IUD).   here for 2 week status post LAPAROSCOPY DIAGNOSTIC , LYSIS OF ENDOMETRIOSIS, REMOVAL OF CERVICAL GRANULATED TISSUE (N/A Abdomen)Procedure.    GYNECOLOGIC HISTORY: No LMP recorded. (Menstrual status: IUD). Contraception:  ***Mirena IUD Menopausal hormone therapy:  n/a Last mammogram:  n/a Last pap smear:  12/27/19 LEEP showed CIN II with Neg margins; 10/31/19 LGSIL:Pos HR HPV; colpo showed HGSIL        OB History    Gravida  0   Para  0   Term  0   Preterm  0   AB  0   Living  0     SAB  0   IAB  0   Ectopic  0   Multiple  0   Live Births  0              There are no problems to display for this patient.   Past Medical History:  Diagnosis Date  . ADHD   . Anxiety    hx hives with anxiety  . Chronic pelvic pain in female   . Depression   . Endometriosis   . GERD (gastroesophageal reflux disease)   . History of abnormal cervical Pap smear   . History of cervical dysplasia 12/2019   s/p LEEP  . History of suicidal tendencies 2019  . IBS (irritable bowel syndrome)   . IUD (intrauterine device) in place    IUD threads cut short for LEEP procedure done 12/27/19  . Migraines   . PONV (postoperative nausea and vomiting)    severe, needs patch  . Wears contact lenses     Past Surgical History:  Procedure Laterality Date  . COLPOSCOPY N/A 12/27/2019   Procedure: COLPOSCOPY;  Surgeon: Patton Salles, MD;  Location: [facility];  Service: Gynecology;  Laterality: N/A;  . fractured arm Right 2008   sx done  . LAPAROSCOPY N/A 05/15/2020   Procedure: LAPAROSCOPY DIAGNOSTIC , LYSIS  OF ENDOMETRIOSIS, REMOVAL OF CERVICAL GRANULATED TISSUE;  Surgeon: Patton Salles, MD;  Location: [facility];  Service: Gynecology;  Laterality: N/A;  . LEEP N/A 12/27/2019   Procedure: LOOP ELECTROSURGICAL  EXCISION PROCEDURE (LEEP)/ECC;  Surgeon: Patton Salles, MD;  Location: [facility];  Service: Gynecology;  Laterality: N/A;  . PELVIC LAPAROSCOPY  2016   dx'd with endometriosis per patient--[place], [place].  Op report indicates lysis of adhesions.   . TONSILLECTOMY  as child    Current Outpatient Medications  Medication Sig Dispense Refill  . dicyclomine (BENTYL) 10 MG capsule Take 10 mg by mouth as needed.     . hydrOXYzine (ATARAX/VISTARIL) 25 MG tablet Take 25 mg by mouth as needed.    Marland Kitchen ibuprofen (ADVIL) 800 MG tablet Take 1 tablet (800 mg total) by mouth every 8 (eight) hours as needed for mild pain. 30 tablet 1  . lamoTRIgine (LAMICTAL) 25 MG tablet Take 25 mg by mouth at bedtime.    . lansoprazole (PREVACID) 15 MG capsule Take 15 mg by mouth daily at 12 noon.    Marland Kitchen levonorgestrel (MIRENA) 20 MCG/24HR IUD 1 each by Intrauterine route once. Inserted 2019    . mirtazapine (REMERON) 15 MG tablet Take 15 mg by mouth at bedtime.    . ondansetron (ZOFRAN) 4 MG tablet Take 1 tablet (4  mg total) by mouth 2 (two) times daily as needed. Take for nausea. 20 tablet 0  . oxyCODONE-acetaminophen (PERCOCET) 5-325 MG tablet Take 1 tablet by mouth every 4 (four) hours as needed for severe pain. 20 tablet 0   No current facility-administered medications for this visit.     ALLERGIES: Blueberry flavor, Hydrocodone, Macrobid [nitrofurantoin], and Sulfa antibiotics  Family History  Problem Relation Age of Onset  . Diabetes Mother   . Cancer Maternal Grandmother 75       Dec from colon cancer  . Diabetes Maternal Grandfather   . Stroke Maternal Grandfather   . Stroke Maternal Aunt     Social History   Socioeconomic History  . Marital status: Single    Spouse name: Not on file  . Number of children: Not on file  . Years of education: Not on file  . Highest education level: Not on file  Occupational History  . Not on file  Tobacco Use  . Smoking status: Never Smoker   . Smokeless tobacco: Never Used  Vaping Use  . Vaping Use: Every day  . Last attempt to quit: 05/20/2019  . Substances: Nicotine  . Devices: quit nicotine jan 2021 vapes cannabus now  Substance and Sexual Activity  . Alcohol use: Not Currently  . Drug use: Yes    Frequency: 1.0 times per week    Types: Marijuana    Comment: uses for nausea last used 12-20-2019  . Sexual activity: Not Currently    Birth control/protection: I.U.D.    Comment: Mirena IUD 05/2017  Other Topics Concern  . Not on file  Social History Narrative  . Not on file   Social Determinants of Health   Financial Resource Strain: Not on file  Food Insecurity: Not on file  Transportation Needs: Not on file  Physical Activity: Not on file  Stress: Not on file  Social Connections: Not on file  Intimate Partner Violence: Not on file    Review of Systems  PHYSICAL EXAMINATION:    There were no vitals taken for this visit.    General appearance: alert, cooperative and appears stated age Head: Normocephalic, without obvious abnormality, atraumatic Neck: no adenopathy, supple, symmetrical, trachea midline and thyroid normal to inspection and palpation Lungs: clear to auscultation bilaterally Breasts: normal appearance, no masses or tenderness, No nipple retraction or dimpling, No nipple discharge or bleeding, No axillary or supraclavicular adenopathy Heart: regular rate and rhythm Abdomen: soft, non-tender, no masses,  no organomegaly Extremities: extremities normal, atraumatic, no cyanosis or edema Skin: Skin color, texture, turgor normal. No rashes or lesions Lymph nodes: Cervical, supraclavicular, and axillary nodes normal. No abnormal inguinal nodes palpated Neurologic: Grossly normal  Pelvic: External genitalia:  no lesions              Urethra:  normal appearing urethra with no masses, tenderness or lesions              Bartholins and Skenes: normal                 Vagina: normal appearing vagina with  normal color and discharge, no lesions              Cervix: no lesions                Bimanual Exam:  Uterus:  normal size, contour, position, consistency, mobility, non-tender              Adnexa: no mass, fullness, tenderness  Rectal exam: {yes no:314532}.  Confirms.              Anus:  normal sphincter tone, no lesions  Chaperone was present for exam.  ASSESSMENT     PLAN     An After Visit Summary was printed and given to the patient.  ______ minutes face to face time of which over 50% was spent in counseling.

## 2020-05-30 ENCOUNTER — Encounter: Payer: Self-pay | Admitting: Obstetrics and Gynecology

## 2020-05-30 ENCOUNTER — Other Ambulatory Visit: Payer: Self-pay | Admitting: Obstetrics and Gynecology

## 2020-05-30 NOTE — Telephone Encounter (Signed)
Thank you for the update!

## 2020-05-30 NOTE — Telephone Encounter (Signed)
Patient missed appointment for post op 05/29/20. Patient had left a message after hours canceling her appointment due to "not feeling well". I was unable to reach patient by phone to reschedule. Letter mailed to patient to call and reschedule.

## 2020-06-05 ENCOUNTER — Telehealth: Payer: Self-pay

## 2020-06-05 NOTE — Telephone Encounter (Signed)
Patient called stating when Zofran Rx sent in on 05/15/20 it was ordered as tablets. She said she cannot take them. Has to be the dissolvable tablets.  She found a few left over from her prescription from [facility] but needs Rx.   Of note, I am currenty having issue currently entering her new pharmacy. [facility] [address], [place], [place] [address].

## 2020-06-06 NOTE — Telephone Encounter (Signed)
Please have patient contact her provider who was previously prescribing her Zofran.  I prescribed this only for immediate post op nausea but will not be able to refill this moving forward.   She has chronic nausea for which she has seen gastroenterology.

## 2020-06-06 NOTE — Telephone Encounter (Signed)
Recording said unavailable but no option to leave a message.

## 2020-06-07 NOTE — Telephone Encounter (Signed)
Spoke with patient and let her know that Dr. Edward Jolly not able to continue prescribing Zofran going forward. Recommended she follow up with her GI doctor. She agreed.

## 2020-06-12 DIAGNOSIS — F4310 Post-traumatic stress disorder, unspecified: Secondary | ICD-10-CM | POA: Diagnosis not present

## 2020-06-12 DIAGNOSIS — F900 Attention-deficit hyperactivity disorder, predominantly inattentive type: Secondary | ICD-10-CM | POA: Diagnosis not present

## 2020-06-12 DIAGNOSIS — F411 Generalized anxiety disorder: Secondary | ICD-10-CM | POA: Diagnosis not present

## 2020-06-13 NOTE — Progress Notes (Deleted)
GYNECOLOGY  VISIT   HPI: 25 y.o.   Single  Caucasian  female   G0P0000 with No LMP recorded. (Menstrual status: IUD).   here for 4 weeks status post LAPAROSCOPY DIAGNOSTIC , LYSIS OF ENDOMETRIOSIS, REMOVAL OF CERVICAL GRANULATED TISSUE (N/A Abdomen).   GYNECOLOGIC HISTORY: No LMP recorded. (Menstrual status: IUD). Contraception:  *** Menopausal hormone therapy:  *** Last mammogram:  *** Last pap smear:   ***        OB History    Gravida  0   Para  0   Term  0   Preterm  0   AB  0   Living  0     SAB  0   IAB  0   Ectopic  0   Multiple  0   Live Births  0              There are no problems to display for this patient.   Past Medical History:  Diagnosis Date  . ADHD   . Anxiety    hx hives with anxiety  . Chronic pelvic pain in female   . Depression   . Endometriosis   . GERD (gastroesophageal reflux disease)   . History of abnormal cervical Pap smear   . History of cervical dysplasia 12/2019   s/p LEEP  . History of suicidal tendencies 2019  . IBS (irritable bowel syndrome)   . IUD (intrauterine device) in place    IUD threads cut short for LEEP procedure done 12/27/19  . Migraines   . PONV (postoperative nausea and vomiting)    severe, needs patch  . Wears contact lenses     Past Surgical History:  Procedure Laterality Date  . COLPOSCOPY N/A 12/27/2019   Procedure: COLPOSCOPY;  Surgeon: Patton Salles, MD;  Location: [facility];  Service: Gynecology;  Laterality: N/A;  . fractured arm Right 2008   sx done  . LAPAROSCOPY N/A 05/15/2020   Procedure: LAPAROSCOPY DIAGNOSTIC , LYSIS  OF ENDOMETRIOSIS, REMOVAL OF CERVICAL GRANULATED TISSUE;  Surgeon: Patton Salles, MD;  Location: [facility];  Service: Gynecology;  Laterality: N/A;  . LEEP N/A 12/27/2019   Procedure: LOOP ELECTROSURGICAL EXCISION PROCEDURE (LEEP)/ECC;  Surgeon: Patton Salles, MD;  Location: [facility];  Service: Gynecology;  Laterality: N/A;  . PELVIC LAPAROSCOPY  2016   dx'd with endometriosis per patient--[place].  Op report indicates lysis of adhesions.   . TONSILLECTOMY  as child    Current Outpatient Medications  Medication Sig Dispense Refill  . dicyclomine (BENTYL) 10 MG capsule Take 10 mg by mouth as needed.     . hydrOXYzine (ATARAX/VISTARIL) 25 MG tablet Take 25 mg by mouth as needed.    Marland Kitchen ibuprofen (ADVIL) 800 MG tablet Take 1 tablet (800 mg total) by mouth every 8 (eight) hours as needed for mild pain. 30 tablet 1  . lamoTRIgine (LAMICTAL) 25 MG tablet Take 25 mg by mouth at bedtime.    . lansoprazole (PREVACID) 15 MG capsule Take 15 mg by mouth daily at 12 noon.    Marland Kitchen levonorgestrel (MIRENA) 20 MCG/24HR IUD 1 each by Intrauterine route once. Inserted 2019    . mirtazapine (REMERON) 15 MG tablet Take 15 mg by mouth at bedtime.    . ondansetron (ZOFRAN) 4 MG tablet Take 1 tablet (4 mg total) by mouth 2 (two) times daily as needed. Take for nausea. 20 tablet  0  . oxyCODONE-acetaminophen (PERCOCET) 5-325 MG tablet Take 1 tablet by mouth every 4 (four) hours as needed for severe pain. 20 tablet 0   No current facility-administered medications for this visit.     ALLERGIES: Blueberry flavor, Hydrocodone, Macrobid [nitrofurantoin], and Sulfa antibiotics  Family History  Problem Relation Age of Onset  . Diabetes Mother   . Cancer Maternal Grandmother 75       Dec from colon cancer  . Diabetes Maternal Grandfather   . Stroke Maternal Grandfather   . Stroke Maternal Aunt     Social History   Socioeconomic History  . Marital status: Single    Spouse name: Not on file  . Number of children: Not on file  . Years of education: Not on file  . Highest education level: Not on file  Occupational History  . Not on file  Tobacco Use  . Smoking status: Never Smoker  . Smokeless tobacco: Never Used  Vaping Use  . Vaping Use: Every day  . Last attempt to quit:  05/20/2019  . Substances: Nicotine  . Devices: quit nicotine jan 2021 vapes cannabus now  Substance and Sexual Activity  . Alcohol use: Not Currently  . Drug use: Yes    Frequency: 1.0 times per week    Types: Marijuana    Comment: uses for nausea last used 12-20-2019  . Sexual activity: Not Currently    Birth control/protection: I.U.D.    Comment: Mirena IUD 05/2017  Other Topics Concern  . Not on file  Social History Narrative  . Not on file   Social Determinants of Health   Financial Resource Strain: Not on file  Food Insecurity: Not on file  Transportation Needs: Not on file  Physical Activity: Not on file  Stress: Not on file  Social Connections: Not on file  Intimate Partner Violence: Not on file    Review of Systems  PHYSICAL EXAMINATION:    There were no vitals taken for this visit.    General appearance: alert, cooperative and appears stated age Head: Normocephalic, without obvious abnormality, atraumatic Neck: no adenopathy, supple, symmetrical, trachea midline and thyroid normal to inspection and palpation Lungs: clear to auscultation bilaterally Breasts: normal appearance, no masses or tenderness, No nipple retraction or dimpling, No nipple discharge or bleeding, No axillary or supraclavicular adenopathy Heart: regular rate and rhythm Abdomen: soft, non-tender, no masses,  no organomegaly Extremities: extremities normal, atraumatic, no cyanosis or edema Skin: Skin color, texture, turgor normal. No rashes or lesions Lymph nodes: Cervical, supraclavicular, and axillary nodes normal. No abnormal inguinal nodes palpated Neurologic: Grossly normal  Pelvic: External genitalia:  no lesions              Urethra:  normal appearing urethra with no masses, tenderness or lesions              Bartholins and Skenes: normal                 Vagina: normal appearing vagina with normal color and discharge, no lesions              Cervix: no lesions                Bimanual  Exam:  Uterus:  normal size, contour, position, consistency, mobility, non-tender              Adnexa: no mass, fullness, tenderness              Rectal exam: {yes no:314532}.  Confirms.              Anus:  normal sphincter tone, no lesions  Chaperone was present for exam.  ASSESSMENT     PLAN     An After Visit Summary was printed and given to the patient.  ______ minutes face to face time of which over 50% was spent in counseling.

## 2020-06-14 ENCOUNTER — Ambulatory Visit: Payer: BC Managed Care – PPO | Admitting: Obstetrics and Gynecology

## 2020-06-18 NOTE — Progress Notes (Signed)
GYNECOLOGY  VISIT   HPI: 25 y.o.   Single  Caucasian  female   G0P0000 with No LMP recorded. (Menstrual status: IUD).   here for 4 weeks status post LAPAROSCOPY DIAGNOSTIC , LYSIS OF ENDOMETRIOSIS, REMOVAL OF CERVICAL GRANULATED TISSUE (N/A Abdomen.)  States she has improvement in pelvic pain but still has right sided pain.  Feels she can lay on her back again.   Taking Lamictal and not Lithium.   Tree fell on the house.   Taking vitamins.   Having increased hair growth on her face and thighs.   Chin hair in a patch.  No chest hair. No excessive acne. Wants evaluation.  TSH has been normal.  GYNECOLOGIC HISTORY: No LMP recorded. (Menstrual status: IUD). Contraception:  Mirena IUD  Menopausal hormone therapy:  n/a Last mammogram:  n/a Last pap smear:   12/27/19 LEEP showed CIN II with Neg margins; 10/31/19 LGSIL:Pos HR HPV; colpo showed HGSIL        OB History    Gravida  0   Para  0   Term  0   Preterm  0   AB  0   Living  0     SAB  0   IAB  0   Ectopic  0   Multiple  0   Live Births  0              There are no problems to display for this patient.   Past Medical History:  Diagnosis Date  . ADHD   . Anxiety    hx hives with anxiety  . Chronic pelvic pain in female   . Depression   . Endometriosis   . GERD (gastroesophageal reflux disease)   . History of abnormal cervical Pap smear   . History of cervical dysplasia 12/2019   s/p LEEP  . History of suicidal tendencies 2019  . IBS (irritable bowel syndrome)   . IUD (intrauterine device) in place    IUD threads cut short for LEEP procedure done 12/27/19  . Migraines   . PONV (postoperative nausea and vomiting)    severe, needs patch  . Wears contact lenses     Past Surgical History:  Procedure Laterality Date  . COLPOSCOPY N/A 12/27/2019   Procedure: COLPOSCOPY;  Surgeon: Patton Salles, MD;  Location: [facility];  Service: Gynecology;  Laterality: N/A;  .  fractured arm Right 2008   sx done  . LAPAROSCOPY N/A 05/15/2020   Procedure: LAPAROSCOPY DIAGNOSTIC , LYSIS  OF ENDOMETRIOSIS, REMOVAL OF CERVICAL GRANULATED TISSUE;  Surgeon: Patton Salles, MD;  Location: [facility];  Service: Gynecology;  Laterality: N/A;  . LEEP N/A 12/27/2019   Procedure: LOOP ELECTROSURGICAL EXCISION PROCEDURE (LEEP)/ECC;  Surgeon: Patton Salles, MD;  Location: [facility];  Service: Gynecology;  Laterality: N/A;  . PELVIC LAPAROSCOPY  2016   dx'd with endometriosis per patient--[place].  Op report indicates lysis of adhesions.   . TONSILLECTOMY  as child    Current Outpatient Medications  Medication Sig Dispense Refill  . hydrOXYzine (ATARAX/VISTARIL) 25 MG tablet Take 25 mg by mouth as needed.    . lamoTRIgine (LAMICTAL) 25 MG tablet Take 25 mg by mouth at bedtime.    . lansoprazole (PREVACID) 15 MG capsule Take 15 mg by mouth daily at 12 noon.    Marland Kitchen levonorgestrel (MIRENA) 20 MCG/24HR IUD 1 each by Intrauterine route once. Inserted 2019    .  mirtazapine (REMERON) 15 MG tablet Take 15 mg by mouth at bedtime.    . ondansetron (ZOFRAN) 4 MG tablet Take 1 tablet (4 mg total) by mouth 2 (two) times daily as needed. Take for nausea. 20 tablet 0   No current facility-administered medications for this visit.     ALLERGIES: Blueberry flavor, Hydrocodone, Lithium, Macrobid [nitrofurantoin], Other, and Sulfa antibiotics  Family History  Problem Relation Age of Onset  . Diabetes Mother   . Cancer Maternal Grandmother 75       Dec from colon cancer  . Diabetes Maternal Grandfather   . Stroke Maternal Grandfather   . Stroke Maternal Aunt     Social History   Socioeconomic History  . Marital status: Single    Spouse name: Not on file  . Number of children: Not on file  . Years of education: Not on file  . Highest education level: Not on file  Occupational History  . Not on file  Tobacco Use  . Smoking  status: Never Smoker  . Smokeless tobacco: Never Used  Vaping Use  . Vaping Use: Every day  . Last attempt to quit: 05/20/2019  . Substances: Nicotine  . Devices: quit nicotine jan 2021 vapes cannabus now  Substance and Sexual Activity  . Alcohol use: Not Currently  . Drug use: Yes    Frequency: 1.0 times per week    Types: Marijuana    Comment: uses for nausea last used 12-20-2019  . Sexual activity: Not Currently    Birth control/protection: I.U.D.    Comment: Mirena IUD 05/2017  Other Topics Concern  . Not on file  Social History Narrative  . Not on file   Social Determinants of Health   Financial Resource Strain: Not on file  Food Insecurity: Not on file  Transportation Needs: Not on file  Physical Activity: Not on file  Stress: Not on file  Social Connections: Not on file  Intimate Partner Violence: Not on file    Review of Systems  All other systems reviewed and are negative.   PHYSICAL EXAMINATION:    BP 100/66   Pulse 94   Ht 5' 6.5" (1.689 m)   Wt 141 lb (64 kg)   SpO2 99%   BMI 22.42 kg/m     General appearance: alert, cooperative and appears stated age   Abdomen: incisions intact, abdomen is soft, non-tender, no masses,  no organomegaly   Pelvic: External genitalia:  no lesions              Urethra:  normal appearing urethra with no masses, tenderness or lesions              Bartholins and Skenes: normal                 Vagina: normal appearing vagina with normal color and discharge, no lesions              Cervix: no lesions                Bimanual Exam:  Uterus:  normal size, contour, position, consistency, mobility, non-tender              Adnexa: no mass, fullness, tenderness        Chaperone was present for exam.  ASSESSMENT  Doing well status post laparoscopic LOA and removal of cervical granulation tissue.  Mirena IUD.  Strings cut short for prior LEEP.  Hx CIN II. Hirsutism.   PLAN  Surgical findings  procedure and pathology report  reviewed.  Pap and HR HPV collected.  Testosterone, LH/FSH, prolactin, cortisol, 17 OH-P, and DHEAS. We did discuss possible PCOS.  Will do routine labs at her annual exam.  FU for annual exam in 6 months and prn.    20 min  total time was spent for this patient encounter, including preparation, face-to-face counseling with the patient, coordination of care, and documentation of the encounter.

## 2020-06-19 ENCOUNTER — Ambulatory Visit (INDEPENDENT_AMBULATORY_CARE_PROVIDER_SITE_OTHER): Payer: BC Managed Care – PPO | Admitting: Obstetrics and Gynecology

## 2020-06-19 ENCOUNTER — Other Ambulatory Visit: Payer: Self-pay

## 2020-06-19 ENCOUNTER — Encounter: Payer: Self-pay | Admitting: Obstetrics and Gynecology

## 2020-06-19 ENCOUNTER — Other Ambulatory Visit (HOSPITAL_COMMUNITY)
Admission: RE | Admit: 2020-06-19 | Discharge: 2020-06-19 | Disposition: A | Discharge status: Home / Self Care | Payer: BC Managed Care – PPO | Source: Ambulatory Visit | Attending: Obstetrics and Gynecology | Admitting: Obstetrics and Gynecology

## 2020-06-19 VITALS — BP 100/66 | HR 94 | Ht 66.5 in | Wt 141.0 lb

## 2020-06-19 DIAGNOSIS — F3181 Bipolar II disorder: Secondary | ICD-10-CM | POA: Diagnosis not present

## 2020-06-19 DIAGNOSIS — Z9889 Other specified postprocedural states: Secondary | ICD-10-CM

## 2020-06-19 DIAGNOSIS — L680 Hirsutism: Secondary | ICD-10-CM | POA: Diagnosis not present

## 2020-06-19 DIAGNOSIS — N871 Moderate cervical dysplasia: Secondary | ICD-10-CM | POA: Diagnosis not present

## 2020-06-19 DIAGNOSIS — R87611 Atypical squamous cells cannot exclude high grade squamous intraepithelial lesion on cytologic smear of cervix (ASC-H): Secondary | ICD-10-CM | POA: Diagnosis not present

## 2020-06-19 DIAGNOSIS — F900 Attention-deficit hyperactivity disorder, predominantly inattentive type: Secondary | ICD-10-CM | POA: Diagnosis not present

## 2020-06-19 DIAGNOSIS — F411 Generalized anxiety disorder: Secondary | ICD-10-CM | POA: Diagnosis not present

## 2020-06-19 NOTE — Patient Instructions (Signed)
Polycystic Ovary Syndrome  Polycystic ovarian syndrome (PCOS) is a common hormonal disorder among women of reproductive age. In most women with PCOS, small fluid-filled sacs (cysts) grow on the ovaries. PCOS can cause problems with menstrual periods and make it hard to get and stay pregnant. If this condition is not treated, it can lead to serious health problems, such as diabetes and heart disease. What are the causes? The cause of this condition is not known. It may be due to certain factors, such as:  Irregular menstrual cycle.  High levels of certain hormones.  Problems with the hormone that helps to control blood sugar (insulin).  Certain genes. What increases the risk? You are more likely to develop this condition if you:  Have a family history of PCOS or type 2 diabetes.  Are overweight, eat unhealthy foods, and are not active. These factors may cause problems with blood sugar control, which can contribute to PCOS or PCOS symptoms. What are the signs or symptoms? Symptoms of this condition include:  Ovarian cysts and sometimes pelvic pain.  Menstrual periods that are not regular or are too heavy.  Inability to get or stay pregnant.  Increased growth of hair on the face, chest, stomach, back, thumbs, thighs, or toes.  Acne or oily skin. Acne may develop during adulthood, and it may not get better with treatment.  Weight gain or obesity.  Patches of thickened and dark brown or black skin on the neck, arms, breasts, or thighs. How is this diagnosed? This condition is diagnosed based on:  Your medical history.  A physical exam that includes a pelvic exam. Your health care provider may look for areas of increased hair growth on your skin.  Tests, such as: ? An ultrasound to check the ovaries for cysts and to view the lining of the uterus. ? Blood tests to check levels of sugar (glucose), female hormone (testosterone), and female hormones (estrogen and progesterone). How  is this treated? There is no cure for this condition, but treatment can help to manage symptoms and prevent more health problems from developing. Treatment varies depending on your symptoms and if you want to have a baby or if you need birth control. Treatment may include:  Making nutrition and lifestyle changes.  Taking the progesterone hormone to start a menstrual period.  Taking birth control pills to help you have regular menstrual periods.  Taking medicines such as: ? Medicines to make you ovulate, if you want to get pregnant. ? Medicine to reduce extra hair growth.  Having surgery in severe cases. This may involve making small holes in one or both of your ovaries. This decreases the amount of testosterone that your body makes. Follow these instructions at home:  Take over-the-counter and prescription medicines only as told by your health care provider.  Follow a healthy meal plan that includes lean proteins, complex carbohydrates, fresh fruits and vegetables, low-fat dairy products, healthy fats, and fiber.  If you are overweight, lose weight as told by your health care provider. Your health care provider can determine how much weight loss is best for you and can help you lose weight safely.  Keep all follow-up visits. This is important. Contact a health care provider if:  Your symptoms do not get better with medicine.  Your symptoms get worse or you develop new symptoms. Summary  Polycystic ovarian syndrome (PCOS) is a common hormonal disorder among women of reproductive age.  PCOS can cause problems with menstrual periods and make it hard   to get and stay pregnant.  If this condition is not treated, it can lead to serious health problems, such as diabetes and heart disease.  There is no cure for this condition, but treatment can help to manage symptoms and prevent more health problems from developing. This information is not intended to replace advice given to you by your  health care provider. Make sure you discuss any questions you have with your health care provider. Document Revised: 10/13/2019 Document Reviewed: 10/13/2019 Elsevier Patient Education  2021 [employer]  

## 2020-06-21 LAB — CYTOLOGY - PAP
Comment: NEGATIVE
Diagnosis: HIGH — AB
High risk HPV: POSITIVE — AB

## 2020-06-23 LAB — TESTOS,TOTAL,FREE AND SHBG (FEMALE)
Free Testosterone: 3 pg/mL (ref 0.1–6.4)
Sex Hormone Binding: 38 nmol/L (ref 17–124)
Testosterone, Total, LC-MS-MS: 24 ng/dL (ref 2–45)

## 2020-06-24 LAB — FSH/LH
FSH: 4.4 m[IU]/mL
LH: 10.6 m[IU]/mL

## 2020-06-24 LAB — CORTISOL: Cortisol, Plasma: 9.4 ug/dL

## 2020-06-24 LAB — PROLACTIN: Prolactin: 14.8 ng/mL

## 2020-06-24 LAB — 17-HYDROXYPROGESTERONE: 17-OH-Progesterone, LC/MS/MS: 104 ng/dL

## 2020-06-24 LAB — DHEA-SULFATE: DHEA-SO4: 333 ug/dL (ref 18–391)

## 2020-06-27 ENCOUNTER — Other Ambulatory Visit: Payer: Self-pay

## 2020-06-27 DIAGNOSIS — R87611 Atypical squamous cells cannot exclude high grade squamous intraepithelial lesion on cytologic smear of cervix (ASC-H): Secondary | ICD-10-CM

## 2020-06-28 NOTE — Progress Notes (Deleted)
  Subjective:     Patient ID: Debbie Warner, female   DOB: 12/02/1995, 25 y.o.   MRN: [id number]  HPI  Patient is here today for colposcopy with pap  06-19-20 ASCUS, cannot exclude ASC-H:Pos HR HPV.  Pap History:  06-19-20 ASCUS:cannot exclude ASC-H:Pos HR HPV 12-27-19 LEEP revealed CIN II with neg.margins 12-01-19 colpo revealed HSIL of cx;poss.extends into deeper layers of cx. 10-31-19 LSIL:Pos HR HPV   Review of Systems  LMP: Contraception: Mirena IUD 05/2017 UPT:     Objective:   Physical Exam     Assessment:     ***    Plan:     ***

## 2020-07-02 ENCOUNTER — Ambulatory Visit: Payer: BC Managed Care – PPO | Admitting: Obstetrics and Gynecology

## 2020-07-02 ENCOUNTER — Telehealth: Payer: Self-pay | Admitting: *Deleted

## 2020-07-02 NOTE — Telephone Encounter (Addendum)
Patient scheduled for C&B on 07/06/20 patient said last time you prescribed Rx to keep her calm. It appears it was Xanax 0.5 mg tablet #2 tablet in July 2021. Patient said this dose, but when to arrived at the office she felt more anxiety. She asked if maybe the dose can be increased?  Rx should be sent to [facility] listed in chart.

## 2020-07-03 ENCOUNTER — Other Ambulatory Visit: Payer: Self-pay | Admitting: Obstetrics and Gynecology

## 2020-07-03 MED ORDER — ALPRAZOLAM 1 MG PO TABS: ORAL_TABLET | ORAL | 0 refills | Status: DC

## 2020-07-03 NOTE — Telephone Encounter (Signed)
Rx sent to [facility] for Xanax 1 mg, take one tablet one hour prior to procedure.  Disp: 2, RF:  none.

## 2020-07-04 DIAGNOSIS — F900 Attention-deficit hyperactivity disorder, predominantly inattentive type: Secondary | ICD-10-CM | POA: Diagnosis not present

## 2020-07-04 DIAGNOSIS — F411 Generalized anxiety disorder: Secondary | ICD-10-CM | POA: Diagnosis not present

## 2020-07-04 DIAGNOSIS — F3181 Bipolar II disorder: Secondary | ICD-10-CM | POA: Diagnosis not present

## 2020-07-04 DIAGNOSIS — F4310 Post-traumatic stress disorder, unspecified: Secondary | ICD-10-CM | POA: Diagnosis not present

## 2020-07-04 NOTE — Telephone Encounter (Signed)
Patient informed. 

## 2020-07-06 ENCOUNTER — Ambulatory Visit: Payer: BC Managed Care – PPO | Admitting: Obstetrics and Gynecology

## 2020-07-13 ENCOUNTER — Ambulatory Visit: Payer: BC Managed Care – PPO | Admitting: Nurse Practitioner

## 2020-07-13 DIAGNOSIS — R82998 Other abnormal findings in urine: Secondary | ICD-10-CM | POA: Diagnosis not present

## 2020-07-13 DIAGNOSIS — R112 Nausea with vomiting, unspecified: Secondary | ICD-10-CM | POA: Diagnosis not present

## 2020-07-13 DIAGNOSIS — R1032 Left lower quadrant pain: Secondary | ICD-10-CM | POA: Diagnosis not present

## 2020-07-18 ENCOUNTER — Telehealth: Payer: Self-pay | Admitting: *Deleted

## 2020-07-18 DIAGNOSIS — F3181 Bipolar II disorder: Secondary | ICD-10-CM | POA: Diagnosis not present

## 2020-07-18 DIAGNOSIS — F900 Attention-deficit hyperactivity disorder, predominantly inattentive type: Secondary | ICD-10-CM | POA: Diagnosis not present

## 2020-07-18 DIAGNOSIS — F411 Generalized anxiety disorder: Secondary | ICD-10-CM | POA: Diagnosis not present

## 2020-07-18 NOTE — Telephone Encounter (Signed)
Patient was contacted by front office staff to reschedule appt for PCOS consult on 3/4 with Dr. Edward Jolly. Patient is scheduled for a colpo on 3/24 w/ Dr. Edward Jolly, recommended PCOS consult at that time. Patient declines to r/s to later date or wait until colpo. Patient request to see first available provider to further discuss.   OV scheduled for 3/7 at 0800 w/ Dr. Oscar La.   Patient verbalizes understanding and is agreeable.   Routing to provider for final review. Patient is agreeable to disposition. Will close encounter.

## 2020-07-19 ENCOUNTER — Telehealth: Payer: Self-pay | Admitting: Obstetrics and Gynecology

## 2020-07-23 ENCOUNTER — Ambulatory Visit: Payer: Self-pay | Admitting: Obstetrics and Gynecology

## 2020-07-23 NOTE — Progress Notes (Deleted)
GYNECOLOGY  VISIT   HPI: 25 y.o.   Single Unavailable Hispanic or Latino  female   [id number] with No LMP recorded. (Menstrual status: IUD).   here for     GYNECOLOGIC HISTORY: No LMP recorded. (Menstrual status: IUD). Contraception:*** Menopausal hormone therapy: ***        OB History    Gravida  0   Para  0   Term  0   Preterm  0   AB  0   Living  0     SAB  0   IAB  0   Ectopic  0   Multiple  0   Live Births  0              There are no problems to display for this patient.   Past Medical History:  Diagnosis Date  . ADHD   . Anxiety    hx hives with anxiety  . Chronic pelvic pain in female   . Depression   . Endometriosis   . GERD (gastroesophageal reflux disease)   . History of abnormal cervical Pap smear   . History of cervical dysplasia 12/2019   s/p LEEP  . History of suicidal tendencies 2019  . IBS (irritable bowel syndrome)   . IUD (intrauterine device) in place    IUD threads cut short for LEEP procedure done 12/27/19  . Migraines   . PONV (postoperative nausea and vomiting)    severe, needs patch  . Wears contact lenses     Past Surgical History:  Procedure Laterality Date  . COLPOSCOPY N/A 12/27/2019   Procedure: COLPOSCOPY;  Surgeon: Patton Salles, MD;  Location: [facility];  Service: Gynecology;  Laterality: N/A;  . fractured arm Right 2008   sx done  . LAPAROSCOPY N/A 05/15/2020   Procedure: LAPAROSCOPY DIAGNOSTIC , LYSIS  OF ENDOMETRIOSIS, REMOVAL OF CERVICAL GRANULATED TISSUE;  Surgeon: Patton Salles, MD;  Location: [facility];  Service: Gynecology;  Laterality: N/A;  . LEEP N/A 12/27/2019   Procedure: LOOP ELECTROSURGICAL EXCISION PROCEDURE (LEEP)/ECC;  Surgeon: Patton Salles, MD;  Location: [facility];  Service: Gynecology;  Laterality: N/A;  . PELVIC LAPAROSCOPY  2016   dx'd with endometriosis per patient--[place], [place].  Op report indicates  lysis of adhesions.   . TONSILLECTOMY  as child    Current Outpatient Medications  Medication Sig Dispense Refill  . ALPRAZolam (XANAX) 1 MG tablet Take 1 tablet (1 mg) by mouth 1 hour prior to procedure. 2 tablet 0  . hydrOXYzine (ATARAX/VISTARIL) 25 MG tablet Take 25 mg by mouth as needed.    . lamoTRIgine (LAMICTAL) 25 MG tablet Take 25 mg by mouth at bedtime.    . lansoprazole (PREVACID) 15 MG capsule Take 15 mg by mouth daily at 12 noon.    Marland Kitchen levonorgestrel (MIRENA) 20 MCG/24HR IUD 1 each by Intrauterine route once. Inserted 2019    . mirtazapine (REMERON) 15 MG tablet Take 15 mg by mouth at bedtime.    . ondansetron (ZOFRAN) 4 MG tablet Take 1 tablet (4 mg total) by mouth 2 (two) times daily as needed. Take for nausea. 20 tablet 0   No current facility-administered medications for this visit.     ALLERGIES: Blueberry flavor, Hydrocodone, Lithium, Macrobid [nitrofurantoin], Other, and Sulfa antibiotics  Family History  Problem Relation Age of Onset  . Diabetes Mother   . Cancer Maternal Grandmother 81  Dec from colon cancer  . Diabetes Maternal Grandfather   . Stroke Maternal Grandfather   . Stroke Maternal Aunt     Social History   Socioeconomic History  . Marital status: Single    Spouse name: Not on file  . Number of children: Not on file  . Years of education: Not on file  . Highest education level: Not on file  Occupational History  . Not on file  Tobacco Use  . Smoking status: Never Smoker  . Smokeless tobacco: Never Used  Vaping Use  . Vaping Use: Every day  . Last attempt to quit: 05/20/2019  . Substances: Nicotine  . Devices: quit nicotine jan 2021 vapes cannabus now  Substance and Sexual Activity  . Alcohol use: Not Currently  . Drug use: Yes    Frequency: 1.0 times per week    Types: Marijuana    Comment: uses for nausea last used 12-20-2019  . Sexual activity: Not Currently    Birth control/protection: I.U.D.    Comment: Mirena IUD 05/2017   Other Topics Concern  . Not on file  Social History Narrative  . Not on file   Social Determinants of Health   Financial Resource Strain: Not on file  Food Insecurity: Not on file  Transportation Needs: Not on file  Physical Activity: Not on file  Stress: Not on file  Social Connections: Not on file  Intimate Partner Violence: Not on file    ROS  PHYSICAL EXAMINATION:    There were no vitals taken for this visit.    General appearance: alert, cooperative and appears stated age Neck: no adenopathy, supple, symmetrical, trachea midline and thyroid {CHL AMB PHY EX THYROID NORM DEFAULT:[phone]::"normal to inspection and palpation"} Breasts: {Exam; breast:13139::"normal appearance, no masses or tenderness"} Abdomen: soft, non-tender; non distended, no masses,  no organomegaly  Pelvic: External genitalia:  no lesions              Urethra:  normal appearing urethra with no masses, tenderness or lesions              Bartholins and Skenes: normal                 Vagina: normal appearing vagina with normal color and discharge, no lesions              Cervix: {CHL AMB PHY EX CERVIX NORM DEFAULT:[phone]::"no lesions"}              Bimanual Exam:  Uterus:  {CHL AMB PHY EX UTERUS NORM DEFAULT:[phone]::"normal size, contour, position, consistency, mobility, non-tender"}              Adnexa: {CHL AMB PHY EX ADNEXA NO MASS DEFAULT:[phone]::"no mass, fullness, tenderness"}              Rectovaginal: {yes no:[id number]}.  Confirms.              Anus:  normal sphincter tone, no lesions  Chaperone was present for exam.  ASSESSMENT     PLAN    An After Visit Summary was printed and given to the patient.  *** minutes face to face time of which over 50% was spent in counseling.

## 2020-07-25 ENCOUNTER — Ambulatory Visit: Payer: BC Managed Care – PPO | Admitting: Obstetrics and Gynecology

## 2020-07-30 DIAGNOSIS — F411 Generalized anxiety disorder: Secondary | ICD-10-CM | POA: Diagnosis not present

## 2020-07-30 DIAGNOSIS — F900 Attention-deficit hyperactivity disorder, predominantly inattentive type: Secondary | ICD-10-CM | POA: Diagnosis not present

## 2020-07-30 DIAGNOSIS — F3181 Bipolar II disorder: Secondary | ICD-10-CM | POA: Diagnosis not present

## 2020-08-06 ENCOUNTER — Telehealth: Payer: Self-pay

## 2020-08-06 NOTE — Telephone Encounter (Addendum)
Patient stated that she had Colposcopy appt scheduled on 07/25/20 with Dr. Edward Jolly. She said she took the Xanax that was prescribed pre-procedure appointment that day but Dr. Edward Jolly was out of office.  Patient is now scheduled for Colposcopy on 08/09/20 and asked if you will send the Xanax in again for her.

## 2020-08-07 NOTE — Telephone Encounter (Signed)
I sent a prescription to her pharmacy for Xanax on 07/03/20 for the upcoming colposcopy procedure.  Please have her check with her pharmacy.

## 2020-08-07 NOTE — Progress Notes (Signed)
  Subjective:     Patient ID: Debbie Warner, female   DOB: [birth year], 25 y.o.   MRN: [id number]  HPI   Patient here today for colposcopy with pap smear 06-19-20 ASC-H:Pos HR HPV. Hx of pap 10-31-19 LSIL:Pos HR HPV colposcopy showing HGSIL. LEEP performed 12-27-19 revealed CIN II with negative margins.  Took 2 Xanax prior to the visit today.   Having acne issues and increased hair growth.  Has LH>> FSH.  Other hormonal labs were normal:  Prolactin, testosterone, 17 OH-P, DHEAS, and cortisol. TSH normal in past.  Interested in treatment.   Review of Systems  All other systems reviewed and are negative.   LMP: IUD Contraception: Mirena IUD 06/2017 UPT: Neg     Objective:   Physical Exam Genitourinary:      Colposcopy - cervix, vagina. Consent for procedure.  3% acetic acid used in vagina and on vulva. White light and green light filter used.  Colposcopy satisfactory:  Yes   __x___          No    _____ Findings:    Cervix:  Minor acetowhite change at 6:00.  Vagina:  No lesions.  Biopsies:   Tenaculum to anterior cervix.  Os finder used to gently dilate cervix.  ECC and biopsy at 6:00 sent to pathology separately. IUD respected and maintained.  Monsel's placed.  Minimal EBL. No complications.     Assessment:     Hirsutism and acne.  ASCUS-H.  Positive HR HPV.  Hx LEEP - CIN II.  Mirena IUD.    Plan:     We discussed her lab work and possible PCOS.  Start spironolactone 100 mg and increase to 200 mg in one week.  I discussed side effects.  FU biopsies.  Aleve, 2 to patient now.  Post colpo precautions given.  FU in 10 weeks.   20 min total time was spent for this patient encounter, including preparation, face-to-face counseling with the patient, coordination of care, and documentation of the encounter.

## 2020-08-07 NOTE — Telephone Encounter (Addendum)
Please see my note. Patient said that she took that previous Xanax Rx and came for visit but you were out of office. Now it has been rescheduled and she needs another Rx.

## 2020-08-08 ENCOUNTER — Other Ambulatory Visit: Payer: Self-pay | Admitting: Obstetrics and Gynecology

## 2020-08-08 MED ORDER — ALPRAZOLAM 1 MG PO TABS: ORAL_TABLET | ORAL | 0 refills | Status: DC

## 2020-08-08 NOTE — Telephone Encounter (Signed)
Spoke with patient and informed her. She said no need for apology. Very kind.

## 2020-08-08 NOTE — Telephone Encounter (Signed)
Please let patient know that I sent in her Rx for Xanax.  I am sorry for any inconvenience in needing to reschedule the appointment.

## 2020-08-08 NOTE — Telephone Encounter (Signed)
Patient is calling to check the status of needing an RX sent to pharmacy for procedure tomorrow (08/09/20).   Sent to [facility] and Dr. Edward Jolly

## 2020-08-09 ENCOUNTER — Other Ambulatory Visit (HOSPITAL_COMMUNITY)
Admission: RE | Admit: 2020-08-09 | Discharge: 2020-08-09 | Disposition: A | Discharge status: Home / Self Care | Payer: BC Managed Care – PPO | Source: Ambulatory Visit | Attending: Obstetrics and Gynecology | Admitting: Obstetrics and Gynecology

## 2020-08-09 ENCOUNTER — Ambulatory Visit: Payer: BC Managed Care – PPO | Admitting: Obstetrics and Gynecology

## 2020-08-09 ENCOUNTER — Other Ambulatory Visit: Payer: Self-pay

## 2020-08-09 ENCOUNTER — Encounter: Payer: Self-pay | Admitting: Obstetrics and Gynecology

## 2020-08-09 VITALS — BP 110/62 | HR 110 | Ht 66.5 in | Wt 141.6 lb

## 2020-08-09 DIAGNOSIS — Z01812 Encounter for preprocedural laboratory examination: Secondary | ICD-10-CM | POA: Diagnosis not present

## 2020-08-09 DIAGNOSIS — R87810 Cervical high risk human papillomavirus (HPV) DNA test positive: Secondary | ICD-10-CM

## 2020-08-09 DIAGNOSIS — R87611 Atypical squamous cells cannot exclude high grade squamous intraepithelial lesion on cytologic smear of cervix (ASC-H): Secondary | ICD-10-CM | POA: Insufficient documentation

## 2020-08-09 DIAGNOSIS — L709 Acne, unspecified: Secondary | ICD-10-CM

## 2020-08-09 DIAGNOSIS — N870 Mild cervical dysplasia: Secondary | ICD-10-CM | POA: Diagnosis not present

## 2020-08-09 DIAGNOSIS — L680 Hirsutism: Secondary | ICD-10-CM

## 2020-08-09 LAB — PREGNANCY, URINE: Preg Test, Ur: NEGATIVE

## 2020-08-09 MED ORDER — SPIRONOLACTONE 100 MG PO TABS: 100.0000 mg | ORAL_TABLET | Freq: Every day | ORAL | 2 refills | Status: DC

## 2020-08-09 NOTE — Patient Instructions (Signed)
Spironolactone Oral Tablets What is this medicine? SPIRONOLACTONE (speer on oh LAK tone) is a diuretic. It helps you make more urine and to lose salt and excess water from your body. It treats swelling from heart, kidney, or liver disease. It treats high blood pressure. It also treats high aldosterone levels in the blood. This medicine may be used for other purposes; ask your health care provider or pharmacist if you have questions. COMMON BRAND NAME(S): Aldactone What should I tell my health care provider before I take this medicine? They need to know if you have any of these conditions:  Addison's disease or low adrenal gland function  high blood level of potassium  kidney disease  liver disease  an unusual or allergic reaction to spironolactone, other medicines, foods, dyes, or preservatives  pregnant or trying to get pregnant  breast-feeding How should I use this medicine? Take this medicine by mouth. Take it as directed on the prescription label at the same time every day. You can take it with or without food. You should always take it the same way. Keep taking it unless your health care provider tells you to stop. Talk to your health care provider about the use of this drug in children. Special care may be needed. Overdosage: If you think you have taken too much of this medicine contact a poison control center or emergency room at once. NOTE: This medicine is only for you. Do not share this medicine with others. What if I miss a dose? If you miss a dose, take it as soon as you can. If it is almost time for your next dose, take only that dose. Do not take double or extra doses. What may interact with this medicine? Do not take this medicine with any of the following medications:  cidofovir  eplerenone  tranylcypromine This medicine may also interact with the following medications:  aspirin  certain medicines for blood pressure or heart disease like benazepril, lisinopril,  losartan, valsartan  certain medicines that treat or prevent blood clots like heparin and enoxaparin  cholestyramine  cyclosporine  digoxin  lithium  medicines that relax muscles for surgery  NSAIDs, medicines for pain and inflammation, like ibuprofen or naproxen  other diuretics  potassium salts or supplements  steroid medicines like prednisone or cortisone  trimethoprim This list may not describe all possible interactions. Give your health care provider a list of all the medicines, herbs, non-prescription drugs, or dietary supplements you use. Also tell them if you smoke, drink alcohol, or use illegal drugs. Some items may interact with your medicine. What should I watch for while using this medicine? Visit your doctor or health care provider for regular checks on your progress. Check your blood pressure as directed. Ask your health care provider what your blood pressure should be. Also, find out when you should contact him or her. Do not treat yourself for coughs, colds, or pain while you are using this medicine without asking your health care provider for advice. Some medicines may increase your blood pressure. Check with your health care provider if you have severe diarrhea, nausea, and vomiting, or if you sweat a lot. The loss of too much body fluid may make it dangerous for you to take this medicine. You may need to be on a special diet while taking this medicine. Ask your health care provider. Also, find out how many glasses of fluid you need to drink each day. You may get drowsy or dizzy. Do not drive, use  machinery, or do anything that needs mental alertness until you know how this medicine affects you. Do not stand or sit up quickly, especially if you are an older patient. This reduces the risk of dizzy or fainting spells. Alcohol may interfere with the effects of this medicine. Avoid alcoholic drinks. Avoid salt substitutes unless you are told otherwise by your health care  provider. What side effects may I notice from receiving this medicine? Side effects that you should report to your health care provider as soon as possible:  allergic reactions (skin rash, itching or hives, swelling of the face, lips, or tongue)  breast enlargement in both males and females  changes in menstrual cycle  gout (severe pain, redness, or swelling in joints like the big toe)  high blood sugar (increased hunger, thirst or urination; unusually weak or tired, blurry vision)  high potassium levels (chest pain; fast irregular heartbeat; muscle weakness)  kidney injury (trouble passing urine or change in the amount of urine)  low blood pressure (dizziness; feeling faint or lightheaded, falls; unusually weak or tired)  low calcium levels (fast heartbeat; muscle cramps or pain; pain, tingling, or numbness in the hands or feet; seizures)  low magnesium levels (fast, irregular heartbeat; feeling faint or lightheaded, falls; muscle cramps or pain) Side effects that usually do not require medical attention (report to your health care provider if they continue or are bothersome):  changes in sex drive or performance  dizziness  headache  upset stomach This list may not describe all possible side effects. Call your doctor for medical advice about side effects. You may report side effects to FDA at 1-800-FDA-1088. Where should I keep my medicine? Keep out of the reach of children and pets. Store below 25 degrees C (77 degrees F). Get rid of any unused medicine after the expiration date. To get rid of medicines that are no longer needed or have expired:  Take the medicine to a medicine take-back program. Check with your pharmacy or law enforcement to find a location.  If you cannot return the medicine, check the label or package insert to see if the medicine should be thrown out in the garbage or flushed down the toilet. If you are not sure, ask your health care provider. If it is  safe to put into the trash, take the medicine out of the container. Mix the medicine with cat litter, dirt, coffee grounds, or other unwanted substance. Seal the mixture in a bag or container. Put it in the trash. NOTE: This sheet is a summary. It may not cover all possible information. If you have questions about this medicine, talk to your doctor, pharmacist, or health care provider.  2021 Elsevier/Gold Standard (2019-07-22 19:57:34) Colposcopy, Care After This sheet gives you information about how to care for yourself after your procedure. Your doctor may also give you more specific instructions. If you have problems or questions, contact your doctor. What can I expect after the procedure? If you did not have a sample of your tissue taken out (did not have a biopsy), you may only have some spotting of blood for a few days. You can go back to your normal activities. If you had a sample of your tissue taken out, it is common to have:  Soreness and mild pain. These may last for a few days.  A light-headed feeling.  Mild bleeding or fluid (discharge) coming from your vagina. The fluid will look dark and grainy. You may have this for a few  days. The fluid may be caused by a liquid that was used during your procedure. You may need to wear a sanitary pad.  Spotting of blood for at least 48 hours after the procedure. Follow these instructions at home: Medicines  Take over-the-counter and prescription medicines only as told by your doctor.  Ask your doctor what medicines you can start taking again. This is very important if you take blood thinners. Activity  Limit your activity for the first day after your procedure as told by your doctor.  For at least 3 days, or for as long as told by your doctor, avoid: ? Douching. ? Using tampons. ? Having sex.  Return to your normal activities as told by your doctor. Ask your doctor what activities are safe for you. General instructions  Drink  enough fluid to keep your pee (urine) pale yellow.  Ask your doctor if you may take baths, swim, or use a hot tub. You may take showers.  If you use birth control (contraception), keep using it.  Keep all follow-up visits as told by your doctor. This is important.   Contact a doctor if:  You get a skin rash. Get help right away if:  You bleed a lot from your vagina. A lot of bleeding means you use more than one pad an hour for 2 hours in a row.  You have clumps of blood (blood clots) coming from your vagina.  You have a fever or chills.  You have signs of infection. This may be fluid coming from your vagina that is: ? Different than normal. ? Yellow. ? Bad-smelling.  You have very bad pain or cramps in your lower belly that do not get better with medicine.  You faint. Summary  If you did not have a sample of your tissue taken out, you may only have some spotting of blood for a few days. You can go back to your normal activities.  If you had a sample of your tissue taken out, it is common to have mild pain for a few days and spotting for 48 hours.  Avoid douching, using tampons, and having sex for at least 3 days after the procedure or for as long as told.  Get help right away if you have a lot of bleeding, very bad pain, or signs of infection. This information is not intended to replace advice given to you by your health care provider. Make sure you discuss any questions you have with your health care provider. Document Revised: 03/06/2020 Document Reviewed: 05/04/2019 Elsevier Patient Education  2021 [employer].

## 2020-08-13 LAB — SURGICAL PATHOLOGY

## 2020-08-14 DIAGNOSIS — F3181 Bipolar II disorder: Secondary | ICD-10-CM | POA: Diagnosis not present

## 2020-08-14 DIAGNOSIS — F411 Generalized anxiety disorder: Secondary | ICD-10-CM | POA: Diagnosis not present

## 2020-08-14 DIAGNOSIS — F900 Attention-deficit hyperactivity disorder, predominantly inattentive type: Secondary | ICD-10-CM | POA: Diagnosis not present

## 2020-08-21 ENCOUNTER — Ambulatory Visit: Payer: BC Managed Care – PPO | Admitting: Obstetrics and Gynecology

## 2020-08-23 DIAGNOSIS — F411 Generalized anxiety disorder: Secondary | ICD-10-CM | POA: Diagnosis not present

## 2020-08-23 DIAGNOSIS — F900 Attention-deficit hyperactivity disorder, predominantly inattentive type: Secondary | ICD-10-CM | POA: Diagnosis not present

## 2020-08-23 DIAGNOSIS — F3181 Bipolar II disorder: Secondary | ICD-10-CM | POA: Diagnosis not present

## 2020-08-28 DIAGNOSIS — F411 Generalized anxiety disorder: Secondary | ICD-10-CM | POA: Diagnosis not present

## 2020-08-28 DIAGNOSIS — F3181 Bipolar II disorder: Secondary | ICD-10-CM | POA: Diagnosis not present

## 2020-08-28 DIAGNOSIS — F900 Attention-deficit hyperactivity disorder, predominantly inattentive type: Secondary | ICD-10-CM | POA: Diagnosis not present

## 2020-09-17 ENCOUNTER — Telehealth: Payer: Self-pay | Admitting: *Deleted

## 2020-09-17 NOTE — Telephone Encounter (Signed)
Patient called requesting medication to help with Hirsutism and acne. I explained that Dr.Silva sent a Rx at visit on 08/09/20 for spironolactone 100 mg tablet to [facility]. Patient said she never picked up Rx, I called [facility] to confirm they still have Rx. They do and will fill for patient. I called patient to relay, but her voicemail is not set up.

## 2020-09-25 DIAGNOSIS — F411 Generalized anxiety disorder: Secondary | ICD-10-CM | POA: Diagnosis not present

## 2020-09-25 DIAGNOSIS — F900 Attention-deficit hyperactivity disorder, predominantly inattentive type: Secondary | ICD-10-CM | POA: Diagnosis not present

## 2020-09-25 DIAGNOSIS — F3181 Bipolar II disorder: Secondary | ICD-10-CM | POA: Diagnosis not present

## 2020-10-17 ENCOUNTER — Other Ambulatory Visit: Payer: Self-pay

## 2020-10-17 ENCOUNTER — Ambulatory Visit: Payer: BC Managed Care – PPO | Admitting: Obstetrics and Gynecology

## 2020-10-17 ENCOUNTER — Encounter: Payer: Self-pay | Admitting: Obstetrics and Gynecology

## 2020-10-17 VITALS — BP 100/60 | HR 102 | Ht 66.5 in | Wt 134.8 lb

## 2020-10-17 DIAGNOSIS — L709 Acne, unspecified: Secondary | ICD-10-CM | POA: Diagnosis not present

## 2020-10-17 DIAGNOSIS — L680 Hirsutism: Secondary | ICD-10-CM | POA: Diagnosis not present

## 2020-10-17 MED ORDER — EFLORNITHINE HCL 13.9 % EX CREA: TOPICAL_CREAM | CUTANEOUS | 0 refills | Status: DC

## 2020-10-17 NOTE — Progress Notes (Signed)
GYNECOLOGY  VISIT   HPI: 25 y.o.   Single  Caucasian  female   G0P0000 with No LMP recorded. (Menstrual status: IUD).   here for follow up.  Spironolactone is not helping with acne and hair growth.  Acne is increasing on her face, chest and back.   Has not seen a dermatologist.  When she had her Nexplanon removed and before her Mirena IUD was placed, patient was on birth control for 2 weeks and she became suicidal.   She is asking about her colposcopy results and follow up.   Young family friend passed away after an accident.   GYNECOLOGIC HISTORY: No LMP recorded. (Menstrual status: IUD). Contraception:  Mirena IUD 05/2017 Menopausal hormone therapy:  n/a Last mammogram:  n/a Last pap smear: 06-19-20 ASC-H:Pos HR HPV,12/27/19 LEEP showed CIN II with Neg margins;  10-31-19 LGSIL:Pos HR HPV; colpo showed HGSIL        OB History    Gravida  0   Para  0   Term  0   Preterm  0   AB  0   Living  0     SAB  0   IAB  0   Ectopic  0   Multiple  0   Live Births  0              There are no problems to display for this patient.   Past Medical History:  Diagnosis Date  . ADHD   . Anxiety    hx hives with anxiety  . Chronic pelvic pain in female   . Depression   . Endometriosis   . GERD (gastroesophageal reflux disease)   . History of abnormal cervical Pap smear   . History of cervical dysplasia 12/2019   s/p LEEP  . History of suicidal tendencies 2019  . IBS (irritable bowel syndrome)   . IUD (intrauterine device) in place    IUD threads cut short for LEEP procedure done 12/27/19  . Migraines   . PONV (postoperative nausea and vomiting)    severe, needs patch  . Wears contact lenses     Past Surgical History:  Procedure Laterality Date  . COLPOSCOPY N/A 12/27/2019   Procedure: COLPOSCOPY;  Surgeon: Patton Salles, MD;  Location: [facility];  Service: Gynecology;  Laterality: N/A;  . fractured arm Right 2008   sx done  .  LAPAROSCOPY N/A 05/15/2020   Procedure: LAPAROSCOPY DIAGNOSTIC , LYSIS  OF ENDOMETRIOSIS, REMOVAL OF CERVICAL GRANULATED TISSUE;  Surgeon: Patton Salles, MD;  Location: [facility];  Service: Gynecology;  Laterality: N/A;  . LEEP N/A 12/27/2019   Procedure: LOOP ELECTROSURGICAL EXCISION PROCEDURE (LEEP)/ECC;  Surgeon: Patton Salles, MD;  Location: [facility];  Service: Gynecology;  Laterality: N/A;  . PELVIC LAPAROSCOPY  2016   dx'd with endometriosis per patient--[place], [place].  Op report indicates lysis of adhesions.   . TONSILLECTOMY  as child    Current Outpatient Medications  Medication Sig Dispense Refill  . clonazePAM (KLONOPIN) 0.5 MG tablet Take 0.5 mg by mouth daily as needed.    . hydrOXYzine (ATARAX/VISTARIL) 25 MG tablet Take 25 mg by mouth as needed.    . lamoTRIgine (LAMICTAL) 100 MG tablet Take 100 mg by mouth at bedtime. Taking 1/2 tablet    . levonorgestrel (MIRENA) 20 MCG/24HR IUD 1 each by Intrauterine route once. Inserted 2019    . mirtazapine (REMERON) 15 MG  tablet Take 15 mg by mouth at bedtime.    . ondansetron (ZOFRAN) 4 MG tablet Take 1 tablet (4 mg total) by mouth 2 (two) times daily as needed. Take for nausea. 20 tablet 0  . spironolactone (ALDACTONE) 100 MG tablet Take 1 tablet (100 mg total) by mouth daily. After one week, increase to taking 2 tablets (200 mg) by mouth daily. 60 tablet 2   No current facility-administered medications for this visit.     ALLERGIES: Blueberry flavor, Hydrocodone, Lithium, Macrobid [nitrofurantoin], Other, and Sulfa antibiotics  Family History  Problem Relation Age of Onset  . Diabetes Mother   . Cancer Maternal Grandmother 75       Dec from colon cancer  . Diabetes Maternal Grandfather   . Stroke Maternal Grandfather   . Stroke Maternal Aunt     Social History   Socioeconomic History  . Marital status: Single    Spouse name: Not on file  . Number of children: Not on  file  . Years of education: Not on file  . Highest education level: Not on file  Occupational History  . Not on file  Tobacco Use  . Smoking status: Never Smoker  . Smokeless tobacco: Never Used  Vaping Use  . Vaping Use: Every day  . Last attempt to quit: 05/20/2019  . Substances: Nicotine  . Devices: quit nicotine jan 2021 vapes cannabus now  Substance and Sexual Activity  . Alcohol use: Not Currently  . Drug use: Yes    Frequency: 1.0 times per week    Types: Marijuana    Comment: uses for nausea last used 12-20-2019  . Sexual activity: Not Currently    Birth control/protection: I.U.D.    Comment: Mirena IUD 05/2017  Other Topics Concern  . Not on file  Social History Narrative  . Not on file   Social Determinants of Health   Financial Resource Strain: Not on file  Food Insecurity: Not on file  Transportation Needs: Not on file  Physical Activity: Not on file  Stress: Not on file  Social Connections: Not on file  Intimate Partner Violence: Not on file    Review of Systems  All other systems reviewed and are negative.   PHYSICAL EXAMINATION:    BP 100/60   Pulse (!) 102   Ht 5' 6.5" (1.689 m)   Wt 134 lb 12.8 oz (61.1 kg)   SpO2 98%   BMI 21.43 kg/m     General appearance: alert, cooperative and appears stated age  Skin: facial cystic acne and coarse facial hair of chin.   ASSESSMENT  Hirsutism.  Adult acne.  Intolerance to combined oral contraception.  Hx CIN II and recent CIN I on colpo biopsy.  Positive HR HPV, no subtyping.   PLAN  Stop Spironolactone.  Start Vaniqa 13.9%.  Apply to face bid in affected area.  Disp:  42 gram, RF none.  Referral to dermatology.  Fu in August for annual exam and pap.   32 min  total time was spent for this patient encounter, including preparation, face-to-face counseling with the patient, coordination of care, and documentation of the encounter.

## 2020-10-23 DIAGNOSIS — F411 Generalized anxiety disorder: Secondary | ICD-10-CM | POA: Diagnosis not present

## 2020-10-23 DIAGNOSIS — F900 Attention-deficit hyperactivity disorder, predominantly inattentive type: Secondary | ICD-10-CM | POA: Diagnosis not present

## 2020-10-23 DIAGNOSIS — F3181 Bipolar II disorder: Secondary | ICD-10-CM | POA: Diagnosis not present

## 2020-11-01 ENCOUNTER — Other Ambulatory Visit: Payer: Self-pay

## 2020-11-01 ENCOUNTER — Other Ambulatory Visit: Payer: Self-pay | Admitting: Medical

## 2020-11-01 ENCOUNTER — Ambulatory Visit (INDEPENDENT_AMBULATORY_CARE_PROVIDER_SITE_OTHER): Payer: BC Managed Care – PPO | Admitting: Medical

## 2020-11-01 ENCOUNTER — Encounter: Payer: Self-pay | Admitting: Medical

## 2020-11-01 VITALS — BP 90/60 | HR 85 | Ht 67.0 in | Wt 137.0 lb

## 2020-11-01 DIAGNOSIS — E559 Vitamin D deficiency, unspecified: Secondary | ICD-10-CM

## 2020-11-01 DIAGNOSIS — F419 Anxiety disorder, unspecified: Secondary | ICD-10-CM | POA: Diagnosis not present

## 2020-11-01 DIAGNOSIS — Z831 Family history of other infectious and parasitic diseases: Secondary | ICD-10-CM | POA: Insufficient documentation

## 2020-11-01 DIAGNOSIS — R110 Nausea: Secondary | ICD-10-CM | POA: Diagnosis not present

## 2020-11-01 DIAGNOSIS — F988 Other specified behavioral and emotional disorders with onset usually occurring in childhood and adolescence: Secondary | ICD-10-CM

## 2020-11-01 DIAGNOSIS — Z8742 Personal history of other diseases of the female genital tract: Secondary | ICD-10-CM

## 2020-11-01 DIAGNOSIS — L700 Acne vulgaris: Secondary | ICD-10-CM

## 2020-11-01 DIAGNOSIS — E282 Polycystic ovarian syndrome: Secondary | ICD-10-CM

## 2020-11-01 DIAGNOSIS — F319 Bipolar disorder, unspecified: Secondary | ICD-10-CM

## 2020-11-01 MED ORDER — CLINDAMYCIN PHOS-BENZOYL PEROX 1-5 % EX GEL: Freq: Two times a day (BID) | CUTANEOUS | 1 refills | Status: AC

## 2020-11-01 MED ORDER — VITAMIN D (ERGOCALCIFEROL) 1.25 MG (50000 UNIT) PO CAPS: 50000.0000 [IU] | ORAL_CAPSULE | ORAL | 1 refills | Status: AC

## 2020-11-01 MED ORDER — ONDANSETRON HCL 4 MG PO TABS: 4.0000 mg | ORAL_TABLET | Freq: Two times a day (BID) | ORAL | 1 refills | Status: DC | PRN

## 2020-11-01 MED ORDER — OMEPRAZOLE 40 MG PO CPDR: 40.0000 mg | DELAYED_RELEASE_CAPSULE | Freq: Every day | ORAL | 1 refills | Status: DC

## 2020-11-01 NOTE — Progress Notes (Signed)
Subjective:  Debbie Warner is a 25 y.o. female who presents for Chief Complaint  Patient presents with   New Patient (Initial Visit)     Here as she needs a PCP.  Her mother sees me for care.   Medical team: Psychiatrist, Dr. Armandina Gemma, [facility]  Here to establish care.  She notes hx/o chronic nausea, since 2013.  Has had some evaluation through gasteroneoloy prior.   Would like to see Dr. Orvan Falconer in the future as her mother sees her now.  Had several test that didn't show specific problems, so colonoscopy was planned.  Insurance declined colonoscopy.  Uses Zofran 2-3 times per week.  She notes having chronic nausea prior to current psychotropic medications.  Was diagnosed with bipolar 04/2020, initially on lithium but has been on current regimen several months, but nausea was there prior to those medicaiton  Sometimes gets constipated, but uses fiber supplement sometimes.   Has concerns about short term memory at times.   This is mild but still a concern.   She sees psychiatry for most of her medications.     She has hx/o migraine, worse if dehydrated.   If she accidentally misses some of her bipolar medicaiton can get a migraines.   Her chronic nausea is not always associated with headaches.   Was 175 lb in 2020, but she attributes weight loss to depression and mental health changes over this period of time.  No other aggravating or relieving factors.    No other c/o.  The following portions of the patient's history were reviewed and updated as appropriate: allergies, current medications, past family history, past medical history, past social history, past surgical history and problem list.  ROS Otherwise as in subjective above   Objective: BP 90/60   Pulse 85   Ht 5\' 7"  (1.702 m)   Wt 137 lb (62.1 kg)   SpO2 95%   BMI 21.46 kg/m   General appearance: alert, no distress, well developed, well nourished, white female Moderate comedones with erythema of face  and cheeks, some on bilat upper arms Neck: supple, no lymphadenopathy, no thyromegaly, no masses Heart: RRR, normal S1, S2, no murmurs Lungs: CTA bilaterally, no wheezes, rhonchi, or rales Abdomen: +bs, soft, mild generalized tendnerss, non distended, no masses, no hepatomegaly, no splenomegaly Pulses: 2+ radial pulses, 2+ pedal pulses, normal cap refill Ext: no edema    Assessment: Encounter Diagnoses  Name Primary?   Chronic nausea Yes   Bipolar affective disorder, remission status unspecified (HCC)    Anxiety    Attention deficit disorder, unspecified hyperactivity presence    History of abnormal cervical Pap smear    PCOS (polycystic ovarian syndrome)    Acne vulgaris      Plan: Bipolar disorder, anxiety - sees psychiatry  Chronic nausea - referral to gastroenterology, Dr. .   I reviewed some prior records from 2021 til now in EMR.    Gastric emptying study 02/2020 normal  CT abdomen pelvis 09/2019 showing ovarian cysts but otherwise normal  CBC with elevated platelets, but otherwise normal 04/2020 CMET normal 10/2019 TSH normal 11/2019 Low vit D 11/2019  Acne - begin trial of Benzaclin gel, discussed proper use, risks/benefits.  F/u 9mo  PCOS, hx/o abnormal pap - Follow with gynecology regarding hormone labs.  Didn't tolerate spironolactone  Vit D deficiency - begin back on supplement    [name] was seen today for new patient (initial visit).  Diagnoses and all orders for this visit:  Chronic nausea -  Ambulatory referral to Gastroenterology  Bipolar affective disorder, remission status unspecified (HCC)  Anxiety -     Ambulatory referral to Gastroenterology  Attention deficit disorder, unspecified hyperactivity presence  History of abnormal cervical Pap smear  PCOS (polycystic ovarian syndrome)  Acne vulgaris  Other orders -     ondansetron (ZOFRAN) 4 MG tablet; Take 1 tablet (4 mg total) by mouth 2 (two) times daily as needed. Take for  nausea. -     omeprazole (PRILOSEC) 40 MG capsule; Take 1 capsule (40 mg total) by mouth daily. -     Vitamin D, Ergocalciferol, (DRISDOL) 1.25 MG (50000 UNIT) CAPS capsule; Take 1 capsule (50,000 Units total) by mouth every 7 (seven) days. -     clindamycin-benzoyl peroxide (BENZACLIN) gel; Apply topically 2 (two) times daily.   Follow up: 26mo

## 2020-11-26 ENCOUNTER — Other Ambulatory Visit: Payer: Self-pay | Admitting: Medical

## 2020-11-26 ENCOUNTER — Telehealth: Payer: Self-pay | Admitting: Medical

## 2020-11-26 MED ORDER — ONDANSETRON 4 MG PO TBDP: 4.0000 mg | ORAL_TABLET | Freq: Three times a day (TID) | ORAL | 1 refills | Status: AC | PRN

## 2020-11-26 NOTE — Telephone Encounter (Signed)
Pts mom called and is requesting a refill on the dissolvable Zofran she stated that works better that the the others

## 2020-11-27 ENCOUNTER — Encounter: Payer: Self-pay | Admitting: Gastroenterology

## 2020-12-12 NOTE — Progress Notes (Signed)
GYNECOLOGY  VISIT   HPI: 25 y.o.   Single  Caucasian  female   G0P0000 with No LMP recorded. (Menstrual status: IUD).   here for annual exam and follow up pap.   No menses with Mirena IUD.   Hx LEEP with CIN II.  Post LEEP pap showed ASCUS-H and colposcopy showed LGSIL 08/09/20.   Having a lot of break outs.  She will see a dermatologist about Accutane.  She stopped spironolactone due to increased acne.   Has bumps on her back.   Will see her GI on August 15.   Not sexually active since Jan. 2021.  Take a MVI daily.   She plans to join a gym again.   GYNECOLOGIC HISTORY: No LMP recorded. (Menstrual status: IUD). Contraception: Mirena IUD 05/2017 Menopausal hormone therapy:  n/a Last mammogram:  n/a Last pap smear: 06-19-20 ASC-H:Pos HR HPV, 12/27/19 LEEP showed CIN II with Neg margins;  10-31-19 LGSIL:Pos HR HPV; colpo showed HGSIL  Completed Gardasil series.         OB History     Gravida  0   Para  0   Term  0   Preterm  0   AB  0   Living  0      SAB  0   IAB  0   Ectopic  0   Multiple  0   Live Births  0              Patient Active Problem List   Diagnosis Date Noted   Bipolar disorder (HCC) 11/01/2020   Anxiety 11/01/2020   Attention deficit disorder 11/01/2020   Family history of HPV infection 11/01/2020   History of abnormal cervical Pap smear 11/01/2020   Chronic nausea 11/01/2020   Acne vulgaris 11/01/2020   PCOS (polycystic ovarian syndrome) 11/01/2020   Vitamin D deficiency 11/01/2020    Past Medical History:  Diagnosis Date   ADHD    Anxiety    hx hives with anxiety   Chronic pelvic pain in female    Depression    Endometriosis    GERD (gastroesophageal reflux disease)    History of abnormal cervical Pap smear    History of cervical dysplasia 12/2019   s/p LEEP   History of suicidal tendencies 2019   IBS (irritable bowel syndrome)    IUD (intrauterine device) in place    IUD threads cut short for LEEP procedure done  12/27/19   Migraines    PONV (postoperative nausea and vomiting)    severe, needs patch   Wears contact lenses     Past Surgical History:  Procedure Laterality Date   COLPOSCOPY N/A 12/27/2019   Procedure: COLPOSCOPY;  Surgeon: Patton Salles, MD;  Location: [facility];  Service: Gynecology;  Laterality: N/A;   fractured arm Right 2008   sx done   LAPAROSCOPY N/A 05/15/2020   Procedure: LAPAROSCOPY DIAGNOSTIC , LYSIS  OF ENDOMETRIOSIS, REMOVAL OF CERVICAL GRANULATED TISSUE;  Surgeon: Patton Salles, MD;  Location: [facility];  Service: Gynecology;  Laterality: N/A;   LEEP N/A 12/27/2019   Procedure: LOOP ELECTROSURGICAL EXCISION PROCEDURE (LEEP)/ECC;  Surgeon: Patton Salles, MD;  Location[facility];  Service: Gynecology;  Laterality: N/A;   PELVIC LAPAROSCOPY  2016   dx'd with endometriosis per patient--[place], [place].  Op report indicates lysis of adhesions.    TONSILLECTOMY  as child    Current Outpatient Medications  Medication Sig Dispense Refill   clindamycin-benzoyl peroxide (BENZACLIN) gel Apply topically 2 (two) times daily. 25 g 1   clonazePAM (KLONOPIN) 0.5 MG tablet Take 0.5 mg by mouth daily as needed.     hydrOXYzine (ATARAX/VISTARIL) 25 MG tablet Take 25 mg by mouth as needed.     lamoTRIgine (LAMICTAL) 100 MG tablet Take 100 mg by mouth at bedtime. Taking 1/2 tablet     mirtazapine (REMERON) 15 MG tablet Take 15 mg by mouth at bedtime.     omeprazole (PRILOSEC) 40 MG capsule TAKE 1 CAPSULE(40 MG) BY MOUTH DAILY 90 capsule 0   ondansetron (ZOFRAN ODT) 4 MG disintegrating tablet Take 1 tablet (4 mg total) by mouth every 8 (eight) hours as needed for nausea or vomiting. 45 tablet 1   Vitamin D, Ergocalciferol, (DRISDOL) 1.25 MG (50000 UNIT) CAPS capsule Take 1 capsule (50,000 Units total) by mouth every 7 (seven) days. 12 capsule 1   levonorgestrel (MIRENA) 20 MCG/24HR IUD 1 each by Intrauterine  route once. Inserted 2019     No current facility-administered medications for this visit.     ALLERGIES: Blueberry flavor, Hydrocodone, Lithium, Macrobid [nitrofurantoin], Other, and Sulfa antibiotics  Family History  Problem Relation Age of Onset   Diabetes Mother    Cancer Maternal Grandmother 70       Dec from colon cancer   Diabetes Maternal Grandfather    Stroke Maternal Grandfather    Stroke Maternal Aunt     Social History   Socioeconomic History   Marital status: Single    Spouse name: Not on file   Number of children: Not on file   Years of education: Not on file   Highest education level: Not on file  Occupational History   Not on file  Tobacco Use   Smoking status: Former    Types: Cigarettes    Quit date: 2021    Years since quitting: 1.5   Smokeless tobacco: Never  Vaping Use   Vaping Use: Every day   Last attempt to quit: 05/20/2019   Substances: Nicotine   Devices: quit nicotine jan 2021 vapes cannabus now  Substance and Sexual Activity   Alcohol use: Not Currently   Drug use: Yes    Frequency: 1.0 times per week    Types: Marijuana    Comment: uses for nausea last used 12-20-2019   Sexual activity: Not Currently    Birth control/protection: I.U.D.    Comment: Mirena IUD 05/2017  Other Topics Concern   Not on file  Social History Narrative   Not on file   Social Determinants of Health   Financial Resource Strain: Not on file  Food Insecurity: Not on file  Transportation Needs: Not on file  Physical Activity: Not on file  Stress: Not on file  Social Connections: Not on file  Intimate Partner Violence: Not on file    Review of Systems  See HPI.  PHYSICAL EXAMINATION:    BP 110/70 (BP Location: Left Arm)   Pulse 82   Resp 20   Ht 5' 6.5" (1.689 m)   BMI 21.78 kg/m     General appearance: alert, cooperative and appears stated age Head: Normocephalic, without obvious abnormality, atraumatic Neck: no adenopathy, supple, symmetrical,  trachea midline and thyroid normal to inspection and palpation Lungs: clear to auscultation bilaterally Breasts: normal appearance, no masses or tenderness, No nipple retraction or dimpling, No nipple discharge or bleeding, No axillary or supraclavicular adenopathy Heart: regular rate and rhythm Abdomen: soft,  non-tender, no masses,  no organomegaly Extremities: extremities normal, atraumatic, no cyanosis or edema Skin: Skin color, texture, turgor normal. Acne noted.  Lymph nodes: Cervical, supraclavicular, and axillary nodes normal. No abnormal inguinal nodes palpated Neurologic: Grossly normal  Pelvic: External genitalia:  no lesions              Urethra:  normal appearing urethra with no masses, tenderness or lesions              Bartholins and Skenes: normal                 Vagina: normal appearing vagina with normal color and discharge, no lesions              Cervix: no lesions.  IUD strings not seen.                 Bimanual Exam:  Uterus:  normal size, contour, position, consistency, mobility, non-tender              Adnexa: no mass, fullness, tenderness              Chaperone was present for exam:  Noreene Larsson, RN.   ASSESSMENT  Well woman with normal GYN exam.  Hx CIN II.   Status post LEEP.  Mirena IUD with short strings.  Status post laparoscopy with lysis of adhesions and removal of cervical granulation tissue.  Acne.   PLAN  Pap and HR HPV testing.  We discussed HPV. We discussed her IUD being effective for 7 years.  STD screening not needed.  Calcium and vit D discussed.  Self breast awareness discussed.  Mammogram starting age 27 yo. Routine labs with PCP and GI.  She will see dermatology.    An After Visit Summary was printed and given to the patient.

## 2020-12-19 ENCOUNTER — Ambulatory Visit (INDEPENDENT_AMBULATORY_CARE_PROVIDER_SITE_OTHER): Payer: BC Managed Care – PPO | Admitting: Obstetrics and Gynecology

## 2020-12-19 ENCOUNTER — Encounter: Payer: Self-pay | Admitting: Obstetrics and Gynecology

## 2020-12-19 ENCOUNTER — Other Ambulatory Visit (HOSPITAL_COMMUNITY)
Admission: RE | Admit: 2020-12-19 | Discharge: 2020-12-19 | Disposition: A | Discharge status: Home / Self Care | Payer: BC Managed Care – PPO | Source: Ambulatory Visit | Attending: Obstetrics and Gynecology | Admitting: Obstetrics and Gynecology

## 2020-12-19 ENCOUNTER — Other Ambulatory Visit: Payer: Self-pay

## 2020-12-19 VITALS — BP 110/70 | HR 82 | Resp 20 | Ht 66.5 in

## 2020-12-19 DIAGNOSIS — Z01419 Encounter for gynecological examination (general) (routine) without abnormal findings: Secondary | ICD-10-CM

## 2020-12-19 NOTE — Patient Instructions (Signed)

## 2020-12-24 LAB — CYTOLOGY - PAP
Comment: NEGATIVE
Diagnosis: HIGH — AB
High risk HPV: POSITIVE — AB

## 2020-12-27 ENCOUNTER — Telehealth: Payer: Self-pay

## 2020-12-27 ENCOUNTER — Other Ambulatory Visit: Payer: Self-pay

## 2020-12-27 DIAGNOSIS — N879 Dysplasia of cervix uteri, unspecified: Secondary | ICD-10-CM

## 2020-12-27 DIAGNOSIS — B977 Papillomavirus as the cause of diseases classified elsewhere: Secondary | ICD-10-CM

## 2020-12-27 NOTE — Telephone Encounter (Signed)
-----   Message from Patton Salles, MD sent at 12/26/2020  8:40 PM EDT ----- Please contact patient with results of her pap.  She has atypical cells and possible high grade dysplasia present.  Her high risk HPV test is positive. No cancer was seen.   She needs another colposcopy.  Please schedule with me.

## 2020-12-27 NOTE — Telephone Encounter (Signed)
Spoke with patient and informed her regarding Pap smear result and need for colpo.   Patient requested Xanax Rx be sent for this procedure stating it had been prescribed previously when she had this procedure.

## 2020-12-31 ENCOUNTER — Ambulatory Visit (INDEPENDENT_AMBULATORY_CARE_PROVIDER_SITE_OTHER): Payer: BC Managed Care – PPO | Admitting: Gastroenterology

## 2020-12-31 ENCOUNTER — Encounter: Payer: Self-pay | Admitting: Gastroenterology

## 2020-12-31 VITALS — BP 98/58 | HR 96 | Ht 66.0 in | Wt 146.0 lb

## 2020-12-31 DIAGNOSIS — R1084 Generalized abdominal pain: Secondary | ICD-10-CM | POA: Diagnosis not present

## 2020-12-31 DIAGNOSIS — R110 Nausea: Secondary | ICD-10-CM

## 2020-12-31 MED ORDER — DICYCLOMINE HCL 20 MG PO TABS: 20.0000 mg | ORAL_TABLET | Freq: Four times a day (QID) | ORAL | 2 refills | Status: AC

## 2020-12-31 MED ORDER — ALPRAZOLAM 1 MG PO TABS: ORAL_TABLET | ORAL | 0 refills | Status: DC

## 2020-12-31 MED ORDER — OMEPRAZOLE 40 MG PO CPDR: 40.0000 mg | DELAYED_RELEASE_CAPSULE | ORAL | 3 refills | Status: DC

## 2020-12-31 NOTE — Patient Instructions (Signed)
It was my pleasure to provide care to you today. Based on our discussion, I am providing you with my recommendations below:  RECOMMENDATION(S):   PRESCRIPTION MEDICATION(S):   We have sent the following medication(s) to your pharmacy:  Dicyclomine  NOTE: If your medication(s) requires a PRIOR AUTHORIZATION, we will receive notification from your pharmacy. Once received, the process to submit for approval may take up to 7-10 business days. You will be contacted about any denials we have received from your insurance company as well as alternatives recommended by your provider.  COLONOSCOPY AND ENDOSCOPY:   You have been scheduled for an endoscopy and a colonoscopy. Please follow the written instructions given to you at your visit today.  PREP:   Please pick up your prep supplies at the pharmacy within the next 1-3 days.  INHALERS:   If you use inhalers (even only as needed), please bring them with you on the day of your procedure.  COLONOSCOPY TIPS:  To reduce nausea and dehydration, stay well hydrated for 3-4 days prior to the exam.  To prevent skin/hemorrhoid irritation - prior to wiping, put A&Dointment or vaseline on the toilet paper. Keep a towel or pad on the bed.  BEFORE STARTING YOUR PREP, drink  64oz of clear liquids in the morning. This will help to flush the colon and will ensure you are well hydrated!!!!  NOTE - This is in addition to the fluids required for to complete your prep. Use of a flavored hard candy, such as grape Rubin Payor, can counteract some of the flavor of the prep and may prevent some nausea.   RECORDS:  We have asked you to sign a Release of Information today to obtain records from Dr. Levora Angel.  FOLLOW UP:  After your procedure, you will receive a call from my office staff regarding my recommendation for follow up.  BMI:  If you are age 83 or younger, your body mass index should be between 19-25. Your There is no height or weight on file to  calculate BMI. If this is out of the aformentioned range listed, please consider follow up with your Primary Care Provider.   MY CHART:  The [place] [facility] providers would like to encourage you to use [facility] to communicate with providers for non-urgent requests or questions.  Due to long hold times on the telephone, sending your provider a message by [facility] may be a faster and more efficient way to get a response.  Please allow 48 business hours for a response.  Please remember that this is for non-urgent requests.   Thank you for trusting me with your gastrointestinal care!    Tressia Danas, MD, MPH

## 2020-12-31 NOTE — Progress Notes (Signed)
SIGNED [facility] DOCUMENTS  Location where fax was sent: Dr. Levora Angel  Documents Faxed: Signed [facility] Confirmation received: Yes Documents and confirmation held for future reference: Yes

## 2020-12-31 NOTE — Progress Notes (Signed)
Referring Provider: Jac Canavan, PA-C Primary Care Physician:  Jac Canavan, PA-C  Reason for Consultation:  Nausea   IMPRESSION:  Chronic nausea Abdominal pain that improves with defecation Alternating bowel habits, more frequently constipation  Suspected irritable bowel syndrome. There are no alarm features including weight loss, blood in the stool, fever, nocturnal symptoms, or family history of colon polyps, colon cancer, or celiac disease.   At this time will check for IBS masqueraders including celiac disease, IBD, food intolerance (lactose, fructose, sucrose), SIBO, thyroid disorder.  Concurrent nausea my suggest motility disorder.    PLAN: Dicyclomine 20 mg QID Continue omeprazole 40 mg QAM EGD with biopsies Colonoscopy to evaluate for IBD Obtain records from Dr. Girtha Hake  Please see the "Patient Instructions" section for addition details about the plan.  HPI: Debbie Warner is a 25 y.o. female referred by PA Tysinger for further evaluation of anxiety and chronic nausea.  History is obtained through the patient, her mother who accompanies her to this appointment, and review of her electronic health record.  She has a history of migraines, ADHD, polycystic ovarian syndrome, vitamin D deficiency, acne, and bipolar diagnosed 12/21 previously treated with lithium. She had lysis of adhesions 05/15/20. Working as a Photographer.   Chronic, intermittent nausea.  No pattern in the time of days. Developed prior to starting psychotropic medications.  Uses Zofran 2-3 times a week with inconsistent relief. Often misses trips or appointments due to the nausea. Finds she is very anxious about eating because of possible nausea, even though eating does not consistently result in nausea.   Has intermittent abdominal pain, worse after eating. Associated alternating diarrhea and constipation although more frequently constipated. Treats the constipation with fiber gummies. Pain  improves with defecation.  Reflux triggered by certain foods. Particularly tomatoes. Requesting food allergy testing.   Previously evaluated by Dr. Doretha Imus in 2021. Endoscopy recommended but declined by her insurance.  Homeless since January 2022. Father just lost his job. Their dogs are staying in a kennel. She is afraid they may have to move back to [place].   Weight fluctuates. Appetite fluctuates.  Previously on Bentyl, omeprazole, famotidine.   Prior evaluation: -HIDA scan 08/2019: Normal with gallbladder ejection fraction of 75%.  Patient had cramping with Ensure ingestion. -CT abdomen and pelvis 09/2019: Ovarian cyst, otherwise normal -Labs in 2021 including CBC with thrombocytosis, normal CMP, normal TSH -Gastric emptying study 02/2020: Normal  No known family history of colon cancer or polyps. No family history of uterine/endometrial cancer, pancreatic cancer or gastric/stomach cancer.   Past Medical History:  Diagnosis Date   ADHD    Anxiety    hx hives with anxiety   Chronic pelvic pain in female    Depression    Endometriosis    GERD (gastroesophageal reflux disease)    History of abnormal cervical Pap smear    History of cervical dysplasia 12/2019   s/p LEEP   History of suicidal tendencies 2019   IBS (irritable bowel syndrome)    IUD (intrauterine device) in place    IUD threads cut short for LEEP procedure done 12/27/19   Migraines    PONV (postoperative nausea and vomiting)    severe, needs patch   Wears contact lenses     Past Surgical History:  Procedure Laterality Date   COLPOSCOPY N/A 12/27/2019   Procedure: COLPOSCOPY;  Surgeon: Patton Salles, MD;  Location: [facility];  Service: Gynecology;  Laterality: N/A;   fractured  arm Right 2008   sx done   LAPAROSCOPY N/A 05/15/2020   Procedure: LAPAROSCOPY DIAGNOSTIC , LYSIS  OF ENDOMETRIOSIS, REMOVAL OF CERVICAL GRANULATED TISSUE;  Surgeon: Patton Salles, MD;   [facility];  Service: Gynecology;  Laterality: N/A;   LEEP N/A 12/27/2019   Procedure: LOOP ELECTROSURGICAL EXCISION PROCEDURE (LEEP)/ECC;  Surgeon: Patton Salles, MD;  Location: [facility];  Service: Gynecology;  Laterality: N/A;   PELVIC LAPAROSCOPY  2016   dx'd with endometriosis per patient--Debbie Warner, Debbie Warner.  Op report indicates lysis of adhesions.    TONSILLECTOMY  as child    Current Outpatient Medications  Medication Sig Dispense Refill   clindamycin-benzoyl peroxide (BENZACLIN) gel Apply topically 2 (two) times daily. 25 g 1   clonazePAM (KLONOPIN) 0.5 MG tablet Take 0.5 mg by mouth daily as needed.     hydrOXYzine (ATARAX/VISTARIL) 25 MG tablet Take 25 mg by mouth as needed.     lamoTRIgine (LAMICTAL) 100 MG tablet Take 100 mg by mouth at bedtime. Taking 1/2 tablet     levonorgestrel (MIRENA) 20 MCG/24HR IUD 1 each by Intrauterine route once. Inserted 2019     mirtazapine (REMERON) 15 MG tablet Take 15 mg by mouth at bedtime.     omeprazole (PRILOSEC) 40 MG capsule TAKE 1 CAPSULE(40 MG) BY MOUTH DAILY 90 capsule 0   ondansetron (ZOFRAN ODT) 4 MG disintegrating tablet Take 1 tablet (4 mg total) by mouth every 8 (eight) hours as needed for nausea or vomiting. 45 tablet 1   Vitamin D, Ergocalciferol, (DRISDOL) 1.25 MG (50000 UNIT) CAPS capsule Take 1 capsule (50,000 Units total) by mouth every 7 (seven) days. 12 capsule 1   No current facility-administered medications for this visit.    Allergies as of 12/31/2020 - Review Complete 12/31/2020  Allergen Reaction Noted   Blueberry flavor Shortness Of Breath and Swelling 05/10/2020   Hydrocodone Itching 10/31/2019   Lithium Other (See Comments) 06/19/2020   Macrobid [nitrofurantoin] Itching 10/31/2019   Other Other (See Comments) 02/02/2020   Sulfa antibiotics Itching 10/31/2019    Family History  Problem Relation Age of Onset   Diabetes Mother    Cancer Maternal Grandmother 66        Dec from colon cancer   Diabetes Maternal Grandfather    Stroke Maternal Grandfather    Stroke Maternal Aunt        Review of Systems: 12 system ROS is negative except as noted above with the addition of anxiety, back pain, depression, headaches, menstrual pain, night sweats, insomnia, and sore throat.   Physical Exam: General:   Alert,  well-nourished, pleasant and cooperative in NAD Head:  Normocephalic and atraumatic. Eyes:  Sclera clear, no icterus.   Conjunctiva pink. Ears:  Normal auditory acuity. Nose:  No deformity, discharge,  or lesions. Mouth:  No deformity or lesions.   Neck:  Supple; no masses or thyromegaly. Lungs:  Clear throughout to auscultation.   No wheezes. Heart:  Regular rate and rhythm; no murmurs. Abdomen:  Soft, nontender, nondistended, normal bowel sounds, no rebound or guarding. No hepatosplenomegaly.   Rectal:  Deferred  Msk:  Symmetrical. No boney deformities LAD: No inguinal or umbilical LAD Extremities:  No clubbing or edema. Neurologic:  Alert and  oriented x4;  grossly nonfocal Skin:  Intact without significant lesions or rashes. Psych:  Alert and cooperative. Normal mood and affect.    Jamyrah Saur L. Orvan Falconer, MD, MPH 12/31/2020, 1:38 PM

## 2020-12-31 NOTE — Telephone Encounter (Signed)
Ok for Xanax in preparation for the visit.   She will need a driver to take her home after the appointment, and she will need to sign the consent form prior to taking the medication.

## 2021-01-01 ENCOUNTER — Ambulatory Visit: Payer: BC Managed Care – PPO | Admitting: Medical

## 2021-01-01 NOTE — Telephone Encounter (Signed)
I called patient and let her know Rx sent and she will need a ride home from the visit. Also, she will plan to stop by the office to sign the consent form prior to day of visit like she has done previously.

## 2021-01-02 DIAGNOSIS — F411 Generalized anxiety disorder: Secondary | ICD-10-CM | POA: Diagnosis not present

## 2021-01-02 DIAGNOSIS — F900 Attention-deficit hyperactivity disorder, predominantly inattentive type: Secondary | ICD-10-CM | POA: Diagnosis not present

## 2021-01-02 DIAGNOSIS — F3181 Bipolar II disorder: Secondary | ICD-10-CM | POA: Diagnosis not present

## 2021-01-07 ENCOUNTER — Encounter: Payer: Self-pay | Admitting: Medical

## 2021-01-07 NOTE — Progress Notes (Signed)
SECOND ATTEMPT:  SIGNED ROI DOCUMENTS   Location where fax was sent: Dr. Levora Angel  Documents Faxed: Signed ROI Confirmation received: Yes Documents and confirmation held for future reference: Yes

## 2021-01-09 DIAGNOSIS — D485 Neoplasm of uncertain behavior of skin: Secondary | ICD-10-CM | POA: Diagnosis not present

## 2021-01-09 DIAGNOSIS — L700 Acne vulgaris: Secondary | ICD-10-CM | POA: Diagnosis not present

## 2021-01-14 ENCOUNTER — Telehealth: Payer: Self-pay | Admitting: Gastroenterology

## 2021-01-14 NOTE — Telephone Encounter (Signed)
Inbound call from patient mother. Have Endo/Colon scheduled for 9/14 with Dr. Orvan Falconer. Patient insurance runs out 8/31. She is asking if will be okay to schedule the procedure with another doctor that has availability on 8/31?  Best contact [phone]

## 2021-01-14 NOTE — Telephone Encounter (Signed)
Reviewed OV note. According to Dr. Orvan Falconer notes, double is not deemed to be urgent. No available appts found with any other provider to accommodate request. Will need to cancel and reschedule at pt earliest convenience once she has secured insurance. Called pt and LVM requesting returned call. Until pt returns call, will keep appt as scheduled so not to create any scheduling concerns or issues should she decide to proceed.

## 2021-01-15 NOTE — Progress Notes (Signed)
Records received from Dr. Rich Brave office and placed on Dr. Orvan Falconer desk for review

## 2021-01-15 NOTE — Telephone Encounter (Signed)
Patient's mother returned call.  Informed of no sooner availability.  Stated will keep appt as is and pay out of pocket for procedures.

## 2021-01-27 ENCOUNTER — Telehealth: Payer: Self-pay | Admitting: Gastroenterology

## 2021-01-27 NOTE — Telephone Encounter (Signed)
Records from Dr. Levora Angel at [facility]: Last office visit 02/01/20 Seen for nausea, vomiting, weight loss treated with Zofran Abdominal pain started in 2015 History of anxiety and bipolar disorder, endometriosis Evaluation included: Normal HIDA 02/23/20, CT abd/pelvis with IV contrast 5/21, celiac serologies, normal liver enzymes and lipase, TSH, CRP Using marijuana a few times every week Treatment trials: dicyclomine, probiotics, pantoprazole Mother and sister with similar symptoms at tthe same age

## 2021-01-29 ENCOUNTER — Telehealth: Payer: Self-pay | Admitting: Gastroenterology

## 2021-01-29 DIAGNOSIS — R110 Nausea: Secondary | ICD-10-CM

## 2021-01-29 MED ORDER — SCOPOLAMINE 1 MG/3DAYS TD PT72: MEDICATED_PATCH | TRANSDERMAL | 0 refills | Status: AC

## 2021-01-29 NOTE — Telephone Encounter (Signed)
Patient called to let us know that she gets an allergic reaction to anesthesia wanted to prepare for tomorrow.

## 2021-01-29 NOTE — Telephone Encounter (Signed)
Per CRNA's recommendation and per Dr Orvan Falconer orders, Rx for Scopolamine patch sent to pt pharmacy of choice.

## 2021-01-29 NOTE — Telephone Encounter (Signed)
Spoke with Cathlyn Parsons, CRNA. States he did speak with pt about her concerns. Advised he is comfortable with pt proceeding at [facility] and may have a Rx for a Scopolamine patch to help with laryngeal spasms and s/sx N/V. Please advise if you are okay with writing this Rx.

## 2021-01-30 ENCOUNTER — Ambulatory Visit (AMBULATORY_SURGERY_CENTER): Payer: 59 | Admitting: Gastroenterology

## 2021-01-30 ENCOUNTER — Other Ambulatory Visit: Payer: Self-pay

## 2021-01-30 ENCOUNTER — Encounter: Payer: Self-pay | Admitting: Gastroenterology

## 2021-01-30 VITALS — BP 102/73 | HR 88 | Temp 98.8°F | Resp 16 | Ht 66.0 in | Wt 146.0 lb

## 2021-01-30 DIAGNOSIS — K2970 Gastritis, unspecified, without bleeding: Secondary | ICD-10-CM

## 2021-01-30 DIAGNOSIS — K219 Gastro-esophageal reflux disease without esophagitis: Secondary | ICD-10-CM | POA: Diagnosis not present

## 2021-01-30 DIAGNOSIS — R110 Nausea: Secondary | ICD-10-CM

## 2021-01-30 DIAGNOSIS — K319 Disease of stomach and duodenum, unspecified: Secondary | ICD-10-CM

## 2021-01-30 DIAGNOSIS — R1084 Generalized abdominal pain: Secondary | ICD-10-CM | POA: Diagnosis not present

## 2021-01-30 DIAGNOSIS — R194 Change in bowel habit: Secondary | ICD-10-CM | POA: Diagnosis not present

## 2021-01-30 DIAGNOSIS — K2289 Other specified disease of esophagus: Secondary | ICD-10-CM

## 2021-01-30 MED ORDER — SODIUM CHLORIDE 0.9 % IV SOLN: 500.0000 mL | INTRAVENOUS | Status: DC

## 2021-01-30 NOTE — Progress Notes (Signed)
Called to room to assist during endoscopic procedure.  Patient ID and intended procedure confirmed with present staff. Received instructions for my participation in the procedure from the performing physician.  

## 2021-01-30 NOTE — Progress Notes (Signed)
Report to PACU, RN, vss, BBS= Clear.  

## 2021-01-30 NOTE — Patient Instructions (Signed)
Thank you for allowing [place] to care for you today! Await biopsy results, approximately 2 weeks.  Will make necessary recommendations at that time, if needed. Resume previous diet and medications today.  Return to normal daily activities tomorrow.   YOU HAD AN ENDOSCOPIC PROCEDURE TODAY AT THE [facility]:   Refer to the procedure report that was given to you for any specific questions about what was found during the examination.  If the procedure report does not answer your questions, please call your gastroenterologist to clarify.  If you requested that your care partner not be given the details of your procedure findings, then the procedure report has been included in a sealed envelope for you to review at your convenience later.  YOU SHOULD EXPECT: Some feelings of bloating in the abdomen. Passage of more gas than usual.  Walking can help get rid of the air that was put into your GI tract during the procedure and reduce the bloating. If you had a lower endoscopy (such as a colonoscopy or flexible sigmoidoscopy) you may notice spotting of blood in your stool or on the toilet paper. If you underwent a bowel prep for your procedure, you may not have a normal bowel movement for a few days.  Please Note:  You might notice some irritation and congestion in your nose or some drainage.  This is from the oxygen used during your procedure.  There is no need for concern and it should clear up in a day or so.  SYMPTOMS TO REPORT IMMEDIATELY:  Following lower endoscopy (colonoscopy or flexible sigmoidoscopy):  Excessive amounts of blood in the stool  Significant tenderness or worsening of abdominal pains  Swelling of the abdomen that is new, acute  Fever of 100F or higher  Following upper endoscopy (EGD)  Vomiting of blood or coffee ground material  New chest pain or pain under the shoulder blades  Painful or persistently difficult swallowing  New shortness of breath  Fever of 100F or  higher  Black, tarry-looking stools  For urgent or emergent issues, a gastroenterologist can be reached at any hour by calling [phone]. Do not use MyChart messaging for urgent concerns.    DIET:  We do recommend a small meal at first, but then you may proceed to your regular diet.  Drink plenty of fluids but you should avoid alcoholic beverages for 24 hours.  ACTIVITY:  You should plan to take it easy for the rest of today and you should NOT DRIVE or use heavy machinery until tomorrow (because of the sedation medicines used during the test).    FOLLOW UP: Our staff will call the number listed on your records 48-72 hours following your procedure to check on you and address any questions or concerns that you may have regarding the information given to you following your procedure. If we do not reach you, we will leave a message.  We will attempt to reach you two times.  During this call, we will ask if you have developed any symptoms of COVID 19. If you develop any symptoms (ie: fever, flu-like symptoms, shortness of breath, cough etc.) before then, please call [phone].  If you test positive for Covid 19 in the 2 weeks post procedure, please call and report this information to [place].    If any biopsies were taken you will be contacted by phone or by letter within the next 1-3 weeks.  Please call us at [phone] if you have not heard about  the biopsies in 3 weeks.    SIGNATURES/CONFIDENTIALITY: You and/or your care partner have signed paperwork which will be entered into your electronic medical record.  These signatures attest to the fact that that the information above on your After Visit Summary has been reviewed and is understood.  Full responsibility of the confidentiality of this discharge information lies with you and/or your care-partner.

## 2021-01-30 NOTE — Progress Notes (Signed)
Referring Provider: Jac Canavan, PA-C Primary Care Physician:  Jac Canavan, PA-C  Reason for Procedure:  Nausea, vomiting, abdominal pain   IMPRESSION:  Chronic nausea Abdominal pain that improves with defecation Alternating bowel habits, more frequently constipation  PLAN: EGD and Colonoscopy in the [facility] today   HPI: Debbie Warner is a 25 y.o. female presents for EGD and colonoscopy to evaluate nausea, vomiting, abdominal pain. No change in history from 12/31/20 office visit. Please see that note for complete details.     Past Medical History:  Diagnosis Date   ADHD    Allergy    Anxiety    hx hives with anxiety   Bipolar 1 disorder, manic, moderate (HCC)    Chronic pelvic pain in female    Depression    Endometriosis    GERD (gastroesophageal reflux disease)    History of abnormal cervical Pap smear    History of cervical dysplasia 12/2019   s/p LEEP   History of suicidal tendencies 2019   IBS (irritable bowel syndrome)    IUD (intrauterine device) in place    IUD threads cut short for LEEP procedure done 12/27/19   Migraines    PONV (postoperative nausea and vomiting)    severe, needs patch   Wears contact lenses     Past Surgical History:  Procedure Laterality Date   COLPOSCOPY N/A 12/27/2019   Procedure: COLPOSCOPY;  Surgeon: Patton Salles, MD;  Location: [facility];  Service: Gynecology;  Laterality: N/A;   fractured arm Right 2008   sx done   LAPAROSCOPY N/A 05/15/2020   Procedure: LAPAROSCOPY DIAGNOSTIC , LYSIS  OF ENDOMETRIOSIS, REMOVAL OF CERVICAL GRANULATED TISSUE;  Surgeon: Patton Salles, MD;  Location: [facility];  Service: Gynecology;  Laterality: N/A;   LEEP N/A 12/27/2019   Procedure: LOOP ELECTROSURGICAL EXCISION PROCEDURE (LEEP)/ECC;  Surgeon: Patton Salles, MD;  Location: [facility];  Service: Gynecology;  Laterality: N/A;   PELVIC LAPAROSCOPY  2016    dx'd with endometriosis per patient--[place], [place].  Op report indicates lysis of adhesions.    TONSILLECTOMY  as child    Current Outpatient Medications  Medication Sig Dispense Refill   clindamycin-benzoyl peroxide (BENZACLIN) gel Apply topically 2 (two) times daily. 25 g 1   clonazePAM (KLONOPIN) 0.5 MG tablet Take 0.5 mg by mouth daily as needed.     hydrOXYzine (ATARAX/VISTARIL) 25 MG tablet Take 25 mg by mouth as needed.     lamoTRIgine (LAMICTAL) 100 MG tablet Take 100 mg by mouth at bedtime. Taking 1/2 tablet     levonorgestrel (MIRENA) 20 MCG/24HR IUD 1 each by Intrauterine route once. Inserted 2019     mirtazapine (REMERON) 15 MG tablet Take 15 mg by mouth at bedtime.     omeprazole (PRILOSEC) 40 MG capsule Take 1 capsule (40 mg total) by mouth every morning. 90 capsule 3   ondansetron (ZOFRAN ODT) 4 MG disintegrating tablet Take 1 tablet (4 mg total) by mouth every 8 (eight) hours as needed for nausea or vomiting. 45 tablet 1   ALPRAZolam (XANAX) 1 MG tablet Take one tab po one hour prior to procedure. (Patient not taking: Reported on 01/30/2021) 2 tablet 0   dicyclomine (BENTYL) 20 MG tablet Take 1 tablet (20 mg total) by mouth in the morning, at noon, in the evening, and at bedtime. (Patient not taking: Reported on 01/30/2021) 120 tablet 2   doxycycline (VIBRAMYCIN) 100  MG capsule Take 100 mg by mouth 2 (two) times daily.     scopolamine (TRANSDERM-SCOP, 1.5 MG,) 1 MG/3DAYS Apply 1 patch topically 1 hour before procedure 1 patch 0   tretinoin (RETIN-A) 0.05 % cream Apply topically at bedtime.     Vitamin D, Ergocalciferol, (DRISDOL) 1.25 MG (50000 UNIT) CAPS capsule Take 1 capsule (50,000 Units total) by mouth every 7 (seven) days. (Patient not taking: Reported on 01/30/2021) 12 capsule 1   Current Facility-Administered Medications  Medication Dose Route Frequency Provider Last Rate Last Admin   0.9 %  sodium chloride infusion  500 mL Intravenous Continuous Tressia Danas, MD         Allergies as of 01/30/2021 - Review Complete 01/30/2021  Allergen Reaction Noted   Blueberry flavor Shortness Of Breath and Swelling 05/10/2020   Hydrocodone Itching 10/31/2019   Lithium Other (See Comments) 06/19/2020   Macrobid [nitrofurantoin] Itching 10/31/2019   Other Other (See Comments) 02/02/2020   Sulfa antibiotics Itching 10/31/2019    Family History  Problem Relation Age of Onset   Diabetes Mother    Stroke Maternal Aunt    Colon cancer Maternal Grandmother    Cancer Maternal Grandmother 13       Dec from colon cancer   Diabetes Maternal Grandfather    Stroke Maternal Grandfather    Esophageal cancer Neg Hx    Rectal cancer Neg Hx    Stomach cancer Neg Hx      Physical Exam: General:   Alert,  well-nourished, pleasant and cooperative in NAD Head:  Normocephalic and atraumatic. Eyes:  Sclera clear, no icterus.   Conjunctiva pink. Mouth:  No deformity or lesions.   Neck:  Supple; no masses or thyromegaly. Lungs:  Clear throughout to auscultation.   No wheezes. Heart:  Regular rate and rhythm; no murmurs. Abdomen:  Soft, non-tender, nondistended, normal bowel sounds, no rebound or guarding.  Msk:  Symmetrical. No boney deformities LAD: No inguinal or umbilical LAD Extremities:  No clubbing or edema. Neurologic:  Alert and  oriented x4;  grossly nonfocal Skin:  No obvious rash or bruise. Psych:  Alert and cooperative. Normal mood and affect.     Yuliza Cara L. Orvan Falconer, MD, MPH 01/30/2021, 1:36 PM

## 2021-01-30 NOTE — Op Note (Signed)
[facility] Patient Name: Debbie Warner Procedure Date: 01/30/2021 1:33 PM MRN: [id number] Endoscopist: Tressia Danas MD, MD Age: 25 Referring MD:  Date of Birth: 1996-04-21 Gender: Female Account #: [id number] Procedure:                Colonoscopy Indications:              Abdominal pain, Change in bowel habits Medicines:                Monitored Anesthesia Care Procedure:                Pre-Anesthesia Assessment:                           - Prior to the procedure, a History and Physical                            was performed, and patient medications and                            allergies were reviewed. The patient's tolerance of                            previous anesthesia was also reviewed. The risks                            and benefits of the procedure and the sedation                            options and risks were discussed with the patient.                            All questions were answered, and informed consent                            was obtained. Prior Anticoagulants: The patient has                            taken no previous anticoagulant or antiplatelet                            agents. ASA Grade Assessment: II - A patient with                            mild systemic disease. After reviewing the risks                            and benefits, the patient was deemed in                            satisfactory condition to undergo the procedure.                           After obtaining informed consent, the colonoscope  was passed under direct vision. Throughout the                            procedure, the patient's blood pressure, pulse, and                            oxygen saturations were monitored continuously. The                            [id number]-HQ190L Colonoscope was introduced through the                            anus and advanced to the 5 cm into the ileum. A                            second forward  view of the right colon was                            performed. The colonoscopy was performed without                            difficulty. The patient tolerated the procedure                            well. The quality of the bowel preparation was                            excellent. The terminal ileum, ileocecal valve,                            appendiceal orifice, and rectum were photographed. Scope In: 1:57:15 PM Scope Out: 2:04:41 PM Scope Withdrawal Time: 0 hours 5 minutes 20 seconds  Total Procedure Duration: 0 hours 7 minutes 26 seconds  Findings:                 The perianal and digital rectal examinations were                            normal.                           The colon (entire examined portion) appeared                            normal. Biopsies were taken throughout the colon                            with a cold forceps for histology. Estimated blood                            loss was minimal.                           The terminal ileum appeared normal.  The exam was otherwise without abnormality on                            direct and retroflexion views. Complications:            No immediate complications. Estimated blood loss:                            Minimal. Estimated Blood Loss:     Estimated blood loss was minimal. Impression:               - Normal examination. Source of symptoms not                            identified. Awaiting colon biopsies. Recommendation:           - Patient has a contact number available for                            emergencies. The signs and symptoms of potential                            delayed complications were discussed with the                            patient. Return to normal activities tomorrow.                            Written discharge instructions were provided to the                            patient.                           - Resume previous diet.                            - Continue present medications.                           - Await pathology results.                           - Emerging evidence supports eating a diet of                            fruits, vegetables, grains, calcium, and yogurt                            while reducing red meat and alcohol may reduce the                            risk of colon cancer.                           - Follow-up in the office to review these results. Tressia Danas MD, MD 01/30/2021 2:17:03 PM This report has  been signed electronically.

## 2021-01-30 NOTE — Op Note (Signed)
[facility] Patient Name: Debbie Warner Procedure Date: 01/30/2021 1:33 PM MRN: [id number] Endoscopist: Tressia Danas MD, MD Age: 25 Referring MD:  Date of Birth: Oct 08, 1995 Gender: Female Account #: [id number] Procedure:                Upper GI endoscopy Indications:              Abdominal pain, Nausea Medicines:                Monitored Anesthesia Care Procedure:                Pre-Anesthesia Assessment:                           - Prior to the procedure, a History and Physical                            was performed, and patient medications and                            allergies were reviewed. The patient's tolerance of                            previous anesthesia was also reviewed. The risks                            and benefits of the procedure and the sedation                            options and risks were discussed with the patient.                            All questions were answered, and informed consent                            was obtained. Prior Anticoagulants: The patient has                            taken no previous anticoagulant or antiplatelet                            agents. ASA Grade Assessment: II - A patient with                            mild systemic disease. After reviewing the risks                            and benefits, the patient was deemed in                            satisfactory condition to undergo the procedure.                           After obtaining informed consent, the endoscope was  passed under direct vision. Throughout the                            procedure, the patient's blood pressure, pulse, and                            oxygen saturations were monitored continuously. The                            GIF [id number] #[id number] was introduced through the                            mouth, and advanced to the third part of duodenum.                            The upper GI endoscopy was  accomplished without                            difficulty. The patient tolerated the procedure                            well. Scope In: Scope Out: Findings:                 The examined esophagus was normal. Biopsies were                            obtained from the proximal and distal esophagus                            with cold forceps for histology.                           The entire examined stomach was normal. Biopsies                            were taken from the antrum, body, and fundus with a                            cold forceps for histology. Estimated blood loss                            was minimal.                           The examined duodenum was normal. Biopsies were                            taken with a cold forceps for histology. Estimated                            blood loss was minimal.                           The exam was otherwise without abnormality. Complications:  No immediate complications. Estimated blood loss:                            Minimal. Estimated Blood Loss:     Estimated blood loss was minimal. Impression:               - Normal esophagus, stomach, and duodenum. Biopsied.                           - No obvious source for symptoms on this                            edxamination. Recommendation:           - Patient has a contact number available for                            emergencies. The signs and symptoms of potential                            delayed complications were discussed with the                            patient. Return to normal activities tomorrow.                            Written discharge instructions were provided to the                            patient.                           - Resume previous diet.                           - Continue present medications.                           - Await pathology results.                           - Proceed with colonoscopy as planned. Tressia Danas MD,  MD 01/30/2021 1:54:42 PM This report has been signed electronically.

## 2021-01-31 ENCOUNTER — Telehealth: Payer: Self-pay

## 2021-01-31 NOTE — Telephone Encounter (Signed)
Appointment scheduled for 02/25/2021 @ 9:50 with Dr. Orvan Falconer. Pt made aware

## 2021-02-01 ENCOUNTER — Telehealth: Payer: Self-pay | Admitting: *Deleted

## 2021-02-01 ENCOUNTER — Telehealth: Payer: Self-pay

## 2021-02-01 NOTE — Telephone Encounter (Signed)
Second post procedure follow up call, no answer 

## 2021-02-01 NOTE — Telephone Encounter (Signed)
  Follow up Call-  Call back number 01/30/2021  Post procedure Call Back phone  # [phone]  Permission to leave phone message Yes     Patient questions:  Message left to call us if necessary.

## 2021-02-12 NOTE — Progress Notes (Signed)
  Subjective:     Patient ID: Debbie Warner, female   DOB: 10-27-1995, 25 y.o.   MRN: [id number]  HPI Patient here today for colposcopy with pap 12-19-20 ASC-H:Pos HR HPV.  Pap History: 12-19-20 pap ASC-H:Pos HR HPV 08-09-20 colpo revealed LGSIL  06-19-20 ASC-H:Pos HR HPV, 12/27/19 LEEP showed CIN II with Neg margins;  10-31-19 LGSIL:Pos HR HPV; colpo showed HGSIL  Moving to [place], [place].   Review of Systems  See HPI.  Contraception:Mirena IUD 05/2017 UPT:  neg     Objective:   Physical Exam Genitourinary:       Colposcopy Consent for procedure.  Acetic acid placed.  Cervix, vagina, and vulva examined.  IUD strings not noted.  Acetowhite change of cervix at 6:00 and 10:00. ECC, biopsy at 6:00, and biopsy at 10:00 taken. Vaginal atrophy noted along right vaginal wall.  Monsel's placed.  Minimal EBL.  Scattered areas of hypopigmentation noted of the vulva.  No biopsy taken.    Assessment:     ASCUS-H, positive HR HPV.  Hx LEEP for CIN II.  Mirena IUD.     Plan:     Follow up biopsy results.  Final plan to follow.

## 2021-02-14 ENCOUNTER — Ambulatory Visit (INDEPENDENT_AMBULATORY_CARE_PROVIDER_SITE_OTHER): Payer: 59 | Admitting: Obstetrics and Gynecology

## 2021-02-14 ENCOUNTER — Encounter: Payer: Self-pay | Admitting: Obstetrics and Gynecology

## 2021-02-14 ENCOUNTER — Other Ambulatory Visit: Payer: Self-pay

## 2021-02-14 VITALS — BP 110/78 | HR 106 | Resp 14 | Ht 67.0 in | Wt 141.0 lb

## 2021-02-14 DIAGNOSIS — Z01812 Encounter for preprocedural laboratory examination: Secondary | ICD-10-CM

## 2021-02-14 DIAGNOSIS — B977 Papillomavirus as the cause of diseases classified elsewhere: Secondary | ICD-10-CM | POA: Diagnosis not present

## 2021-02-14 DIAGNOSIS — R87611 Atypical squamous cells cannot exclude high grade squamous intraepithelial lesion on cytologic smear of cervix (ASC-H): Secondary | ICD-10-CM

## 2021-02-14 LAB — PREGNANCY, URINE: Preg Test, Ur: NEGATIVE

## 2021-02-14 NOTE — Patient Instructions (Signed)
Colposcopy, Care After This sheet gives you information about how to care for yourself after your procedure. Your doctor may also give you more specific instructions. If youhave problems or questions, contact your doctor. What can I expect after the procedure? If you did not have a sample of your tissue taken out (did not have a biopsy), you may only have some spotting of blood for a few days. You can go back toyour normal activities. If you had a sample of your tissue taken out, it is common to have: Soreness and mild pain. These may last for a few days. A light-headed feeling. Mild bleeding or fluid (discharge) coming from your vagina. The fluid will look dark and grainy. You may have this for a few days. The fluid may be caused by a liquid that was used during your procedure. You may need to wear a sanitary pad. Spotting of blood for at least 48 hours after the procedure. Follow these instructions at home: Medicines Take over-the-counter and prescription medicines only as told by your doctor. Ask your doctor what medicines you can start taking again. This is very important if you take blood thinners. Activity Limit your activity for the first day after your procedure as told by your doctor. For at least 3 days, or for as long as told by your doctor, avoid: Douching. Using tampons. Having sex. Return to your normal activities as told by your doctor. Ask your doctor what activities are safe for you. General instructions  Drink enough fluid to keep your pee (urine) pale yellow. Ask your doctor if you may take baths, swim, or use a hot tub. You may take showers. If you use birth control (contraception), keep using it. Keep all follow-up visits as told by your doctor. This is important.  Contact a doctor if: You get a skin rash. Get help right away if: You bleed a lot from your vagina. A lot of bleeding means you use more than one pad an hour for 2 hours in a row. You have clumps of  blood (blood clots) coming from your vagina. You have a fever or chills. You have signs of infection. This may be fluid coming from your vagina that is: Different than normal. Yellow. Bad-smelling. You have very bad pain or cramps in your lower belly that do not get better with medicine. You faint. Summary If you did not have a sample of your tissue taken out, you may only have some spotting of blood for a few days. You can go back to your normal activities. If you had a sample of your tissue taken out, it is common to have mild pain for a few days and spotting for 48 hours. Avoid douching, using tampons, and having sex for at least 3 days after the procedure or for as long as told. Get help right away if you have a lot of bleeding, very bad pain, or signs of infection. This information is not intended to replace advice given to you by your health care provider. Make sure you discuss any questions you have with your healthcare provider. Document Revised: 03/06/2020 Document Reviewed: 05/04/2019 Elsevier Patient Education  2022 Elsevier Inc.  

## 2021-02-15 ENCOUNTER — Other Ambulatory Visit (HOSPITAL_COMMUNITY)
Admission: RE | Admit: 2021-02-15 | Discharge: 2021-02-15 | Disposition: A | Discharge status: Home / Self Care | Payer: 59 | Source: Ambulatory Visit | Attending: Obstetrics and Gynecology | Admitting: Obstetrics and Gynecology

## 2021-02-15 DIAGNOSIS — B977 Papillomavirus as the cause of diseases classified elsewhere: Secondary | ICD-10-CM | POA: Insufficient documentation

## 2021-02-15 DIAGNOSIS — R87611 Atypical squamous cells cannot exclude high grade squamous intraepithelial lesion on cytologic smear of cervix (ASC-H): Secondary | ICD-10-CM | POA: Diagnosis present

## 2021-02-19 LAB — SURGICAL PATHOLOGY

## 2021-02-20 ENCOUNTER — Other Ambulatory Visit: Payer: Self-pay

## 2021-02-20 DIAGNOSIS — R1084 Generalized abdominal pain: Secondary | ICD-10-CM

## 2021-02-20 DIAGNOSIS — R110 Nausea: Secondary | ICD-10-CM

## 2021-02-20 MED ORDER — OMEPRAZOLE 40 MG PO CPDR
40.0000 mg | DELAYED_RELEASE_CAPSULE | Freq: Two times a day (BID) | ORAL | 0 refills | Status: DC
Start: 2021-02-20 — End: 2021-03-07

## 2021-02-25 ENCOUNTER — Ambulatory Visit: Payer: 59 | Admitting: Gastroenterology

## 2021-03-07 ENCOUNTER — Other Ambulatory Visit: Payer: Self-pay

## 2021-03-07 DIAGNOSIS — R1084 Generalized abdominal pain: Secondary | ICD-10-CM

## 2021-03-07 DIAGNOSIS — R110 Nausea: Secondary | ICD-10-CM

## 2021-03-07 MED ORDER — OMEPRAZOLE 40 MG PO CPDR: 40.0000 mg | DELAYED_RELEASE_CAPSULE | Freq: Two times a day (BID) | ORAL | 0 refills | Status: AC

## 2021-03-08 ENCOUNTER — Telehealth: Payer: Self-pay

## 2021-03-08 NOTE — Telephone Encounter (Signed)
PRIOR AUTHORIZATION  PA initiation date: 03/08/21  Medication: Omeprazole Insurance Company: Health visitor completed electronically through [employer] My Meds: Yes  Will await insurance response re: approval/denial.  Debbie Warner (Key: [id number])Need help? Call us at [phone] Status Sent to [employer] The plan will fax you a determination, typically within 1 to 5 business days.  How do I follow up? Drug Omeprazole 40MG  dr capsules Form [employer] Prior Authorization Request Form Prior Authorization for [employer] members [phone] [phone]

## 2021-03-28 ENCOUNTER — Other Ambulatory Visit: Payer: Self-pay | Admitting: Gastroenterology

## 2021-03-28 DIAGNOSIS — R110 Nausea: Secondary | ICD-10-CM

## 2021-03-28 DIAGNOSIS — R1084 Generalized abdominal pain: Secondary | ICD-10-CM

## 2021-12-18 NOTE — Progress Notes (Deleted)
26 y.o. G28P0000 Single Caucasian female here for annual exam.    PCP:  Crosby Oyster, PA-C   No LMP recorded. (Menstrual status: IUD).           Sexually active: {yes no:314532}  The current method of family planning is IUD--Mirena 05/2017.    Exercising: {yes no:314532}  {types:19826} Smoker:  ***?Former  Health Maintenance: Pap:  12-19-20 ASCU-H:Pos HR HPV, 06-19-20 ASC-H:Pos HR HPV, 10-31-19 LGSIL:Pos HR HPV History of abnormal Pap:  Yes, 02-15-21 colpo LGSIL, 12-19-20 ASCU-H:Pos HR HPV, 08-09-20 colpo LGSIL, 06-19-20 ASC-H:Pos HR HPV, 12-27-19 LEEP CIN II;neg margins,12-01-19 colpo HGSIL of cx, 10-31-19 LGSIL:Pos HR HPV.  Hx of abnormal pap 11/2018 and had colposcopy but no treatment--in [place] MMG:  n/a Colonoscopy:  01-30-21 normal BMD:   n/a  Result  n/a TDaP:  *** Gardasil:   COMPLETED HIV: 10-31-19 NR Hep C: 10-31-19 Neg Screening Labs:  Hb today: ***, Urine today: ***   reports that she quit smoking about 2 years ago. Her smoking use included cigarettes. She has never used smokeless tobacco. She reports that she does not currently use alcohol. She reports current drug use. Frequency: 1.00 time per week. Drug: Marijuana.  Past Medical History:  Diagnosis Date   ADHD    Allergy    Anxiety    hx hives with anxiety   Bipolar 1 disorder, manic, moderate (HCC)    Chronic pelvic pain in female    Depression    Endometriosis    GERD (gastroesophageal reflux disease)    History of abnormal cervical Pap smear    History of cervical dysplasia 12/2019   s/p LEEP   History of suicidal tendencies 2019   IBS (irritable bowel syndrome)    IUD (intrauterine device) in place    IUD threads cut short for LEEP procedure done 12/27/19   Migraines    PONV (postoperative nausea and vomiting)    severe, needs patch   Wears contact lenses     Past Surgical History:  Procedure Laterality Date   COLPOSCOPY N/A 12/27/2019   Procedure: COLPOSCOPY;  Surgeon: Patton Salles, MD;  Location: [facility];  Service: Gynecology;  Laterality: N/A;   fractured arm Right 2008   sx done   LAPAROSCOPY N/A 05/15/2020   Procedure: LAPAROSCOPY DIAGNOSTIC , LYSIS  OF ENDOMETRIOSIS, REMOVAL OF CERVICAL GRANULATED TISSUE;  Surgeon: Patton Salles, MD;  Location: [facility];  Service: Gynecology;  Laterality: N/A;   LEEP N/A 12/27/2019   Procedure: LOOP ELECTROSURGICAL EXCISION PROCEDURE (LEEP)/ECC;  Surgeon: Patton Salles, MD;  Location: [facility];  Service: Gynecology;  Laterality: N/A;   PELVIC LAPAROSCOPY  2016   dx'd with endometriosis per patient--[place], [place].  Op report indicates lysis of adhesions.    TONSILLECTOMY  as child    Current Outpatient Medications  Medication Sig Dispense Refill   clindamycin-benzoyl peroxide (BENZACLIN) gel Apply topically 2 (two) times daily. 25 g 1   clonazePAM (KLONOPIN) 0.5 MG tablet Take 0.5 mg by mouth daily as needed.     dicyclomine (BENTYL) 20 MG tablet Take 1 tablet (20 mg total) by mouth in the morning, at noon, in the evening, and at bedtime. 120 tablet 2   doxycycline (VIBRAMYCIN) 100 MG capsule Take 100 mg by mouth 2 (two) times daily. (Patient not taking: Reported on 02/14/2021)     hydrOXYzine (ATARAX/VISTARIL) 25 MG tablet Take 25 mg by mouth as needed.  lamoTRIgine (LAMICTAL) 100 MG tablet Take 100 mg by mouth at bedtime. Taking 1/2 tablet     levonorgestrel (MIRENA) 20 MCG/24HR IUD 1 each by Intrauterine route once. Inserted 2019     mirtazapine (REMERON) 15 MG tablet Take 15 mg by mouth at bedtime.     omeprazole (PRILOSEC) 40 MG capsule Take 1 capsule (40 mg total) by mouth 2 (two) times daily. 60 capsule 0   ondansetron (ZOFRAN ODT) 4 MG disintegrating tablet Take 1 tablet (4 mg total) by mouth every 8 (eight) hours as needed for nausea or vomiting. 45 tablet 1   scopolamine (TRANSDERM-SCOP, 1.5 MG,) 1 MG/3DAYS Apply 1 patch topically 1 hour before procedure 1 patch 0    tretinoin (RETIN-A) 0.05 % cream Apply topically at bedtime.     Vitamin D, Ergocalciferol, (DRISDOL) 1.25 MG (50000 UNIT) CAPS capsule Take 1 capsule (50,000 Units total) by mouth every 7 (seven) days. 12 capsule 1   No current facility-administered medications for this visit.    Family History  Problem Relation Age of Onset   Diabetes Mother    Stroke Maternal Aunt    Colon cancer Maternal Grandmother    Cancer Maternal Grandmother 73       Dec from colon cancer   Diabetes Maternal Grandfather    Stroke Maternal Grandfather    Esophageal cancer Neg Hx    Rectal cancer Neg Hx    Stomach cancer Neg Hx     Review of Systems  Exam:   There were no vitals taken for this visit.    General appearance: alert, cooperative and appears stated age Head: normocephalic, without obvious abnormality, atraumatic Neck: no adenopathy, supple, symmetrical, trachea midline and thyroid normal to inspection and palpation Lungs: clear to auscultation bilaterally Breasts: normal appearance, no masses or tenderness, No nipple retraction or dimpling, No nipple discharge or bleeding, No axillary adenopathy Heart: regular rate and rhythm Abdomen: soft, non-tender; no masses, no organomegaly Extremities: extremities normal, atraumatic, no cyanosis or edema Skin: skin color, texture, turgor normal. No rashes or lesions Lymph nodes: cervical, supraclavicular, and axillary nodes normal. Neurologic: grossly normal  Pelvic: External genitalia:  no lesions              No abnormal inguinal nodes palpated.              Urethra:  normal appearing urethra with no masses, tenderness or lesions              Bartholins and Skenes: normal                 Vagina: normal appearing vagina with normal color and discharge, no lesions              Cervix: no lesions              Pap taken: {yes no:314532} Bimanual Exam:  Uterus:  normal size, contour, position, consistency, mobility, non-tender              Adnexa: no  mass, fullness, tenderness              Rectal exam: {yes no:314532}.  Confirms.              Anus:  normal sphincter tone, no lesions  Chaperone was present for exam:  ***  Assessment:   Well woman visit with gynecologic exam.   Plan: Mammogram screening discussed. Self breast awareness reviewed. Pap and HR HPV as above. Guidelines for Calcium, Vitamin D, regular  exercise program including cardiovascular and weight bearing exercise.   Follow up annually and prn.   Additional counseling given.  {yes [id number]. _______ minutes face to face time of which over 50% was spent in counseling.    After visit summary provided.

## 2021-12-23 ENCOUNTER — Ambulatory Visit: Payer: BC Managed Care – PPO | Admitting: Obstetrics and Gynecology

## 2021-12-23 DIAGNOSIS — Z0289 Encounter for other administrative examinations: Secondary | ICD-10-CM

## 2022-01-21 IMAGING — NM NM GASTRIC EMPTYING
3 series · 3 of 3 positions shown · non-contrast
Comparison: None.

CLINICAL DATA: Nausea

EXAM:
NUCLEAR MEDICINE GASTRIC EMPTYING SCAN
TECHNIQUE: After oral ingestion of radiolabeled meal, sequential abdominal
images were obtained for 120 minutes. Residual percentage of
activity remaining within the stomach was calculated at 60 and 120
minutes.
RADIOPHARMACEUTICALS:  Two mCi Rc-LLm sulfur colloid in standardized
meal

[Series 1: 0 min · 4.14mm/px · 1 of 1 slices shown]
[im 1/1]
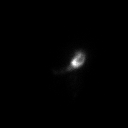

[Series 2: 1 hr · 4.14mm/px · 1 of 1 slices shown]
[im 1/1]
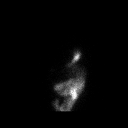

[Series 3: 2 hr · 4.14mm/px · 1 of 1 slices shown]
[im 1/1]
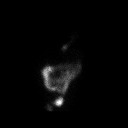

[3 of 3 positions shown; findings below may reference images not displayed]

FINDINGS: Expected location of the stomach in the left upper quadrant.
Ingested meal empties the stomach gradually over the course of the
study with 17.6% retention at 60 min and 5% retention at 120 min
(normal retention less than 30% at a 120 min).
IMPRESSION: Normal gastric emptying study.
